# Patient Record
Sex: Male | Born: 1950 | Race: Black or African American | Hispanic: No | Marital: Married | Smoking: Former smoker
Health system: Southern US, Community
[De-identification: ages and names within clinical notes are randomized; demographics above are authoritative.]

## PROBLEM LIST (undated history)

## (undated) DIAGNOSIS — K219 Gastro-esophageal reflux disease without esophagitis: Secondary | ICD-10-CM

## (undated) DIAGNOSIS — E785 Hyperlipidemia, unspecified: Secondary | ICD-10-CM

## (undated) DIAGNOSIS — N189 Chronic kidney disease, unspecified: Secondary | ICD-10-CM

## (undated) DIAGNOSIS — E119 Type 2 diabetes mellitus without complications: Secondary | ICD-10-CM

## (undated) DIAGNOSIS — R202 Paresthesia of skin: Secondary | ICD-10-CM

## (undated) DIAGNOSIS — I10 Essential (primary) hypertension: Secondary | ICD-10-CM

## (undated) DIAGNOSIS — G4730 Sleep apnea, unspecified: Secondary | ICD-10-CM

## (undated) DIAGNOSIS — J309 Allergic rhinitis, unspecified: Secondary | ICD-10-CM

## (undated) HISTORY — DX: Gastro-esophageal reflux disease without esophagitis: K21.9

## (undated) HISTORY — DX: Paresthesia of skin: R20.2

## (undated) HISTORY — DX: Type 2 diabetes mellitus without complications: E11.9

## (undated) HISTORY — DX: Chronic kidney disease, unspecified: N18.9

## (undated) HISTORY — PX: CYSTECTOMY: SUR359

## (undated) HISTORY — DX: Allergic rhinitis, unspecified: J30.9

## (undated) HISTORY — DX: Hyperlipidemia, unspecified: E78.5

## (undated) HISTORY — DX: Essential (primary) hypertension: I10

## (undated) HISTORY — DX: Sleep apnea, unspecified: G47.30

---

## 2004-04-30 ENCOUNTER — Encounter (INDEPENDENT_AMBULATORY_CARE_PROVIDER_SITE_OTHER): Payer: Self-pay | Admitting: Specialist

## 2004-04-30 ENCOUNTER — Ambulatory Visit (HOSPITAL_COMMUNITY): Admission: RE | Admit: 2004-04-30 | Discharge: 2004-04-30 | Payer: Self-pay | Admitting: *Deleted

## 2006-10-07 ENCOUNTER — Observation Stay (HOSPITAL_COMMUNITY): Admission: EM | Admit: 2006-10-07 | Discharge: 2006-10-08 | Payer: Self-pay | Admitting: Emergency Medicine

## 2008-05-17 ENCOUNTER — Encounter: Admission: RE | Admit: 2008-05-17 | Discharge: 2008-05-17 | Payer: Self-pay | Admitting: Internal Medicine

## 2009-05-30 ENCOUNTER — Observation Stay (HOSPITAL_COMMUNITY): Admission: EM | Admit: 2009-05-30 | Discharge: 2009-05-31 | Payer: Self-pay | Admitting: Emergency Medicine

## 2009-06-06 ENCOUNTER — Encounter: Payer: Self-pay | Admitting: Cardiovascular Disease

## 2009-12-05 ENCOUNTER — Encounter: Admission: RE | Admit: 2009-12-05 | Discharge: 2009-12-05 | Payer: Self-pay | Admitting: Internal Medicine

## 2010-07-20 ENCOUNTER — Ambulatory Visit: Payer: Self-pay | Admitting: Cardiovascular Disease

## 2010-07-27 ENCOUNTER — Ambulatory Visit: Payer: Self-pay | Admitting: Cardiovascular Disease

## 2010-07-27 ENCOUNTER — Ambulatory Visit: Payer: Self-pay | Admitting: Cardiology

## 2010-07-27 ENCOUNTER — Encounter: Payer: Self-pay | Admitting: Cardiovascular Disease

## 2010-07-27 ENCOUNTER — Ambulatory Visit (HOSPITAL_COMMUNITY): Admission: RE | Admit: 2010-07-27 | Discharge: 2010-07-27 | Payer: Self-pay | Admitting: Cardiology

## 2010-11-25 ENCOUNTER — Encounter: Payer: Self-pay | Admitting: Internal Medicine

## 2011-02-10 LAB — DIFFERENTIAL
Eosinophils Relative: 1 % (ref 0–5)
Lymphocytes Relative: 28 % (ref 12–46)
Neutro Abs: 2.7 10*3/uL (ref 1.7–7.7)
Neutrophils Relative %: 53 % (ref 43–77)

## 2011-02-10 LAB — BASIC METABOLIC PANEL
BUN: 19 mg/dL (ref 6–23)
CO2: 30 mEq/L (ref 19–32)
GFR calc non Af Amer: 58 mL/min — ABNORMAL LOW (ref >60)
Potassium: 3.6 mEq/L (ref 3.5–5.1)

## 2011-02-10 LAB — CK TOTAL AND CKMB (NOT AT ARMC)
CK, MB: 13.4 ng/mL — ABNORMAL HIGH (ref 0.3–4.0)
Relative Index: 0.8 (ref 0.0–2.5)
Total CK: 1703 U/L — ABNORMAL HIGH (ref 7–232)

## 2011-02-10 LAB — POCT CARDIAC MARKERS: Myoglobin, poc: 177 ng/mL (ref 12–200)

## 2011-02-10 LAB — CBC
HCT: 44.3 % (ref 39.0–52.0)
RDW: 11.7 % (ref 11.5–15.5)
WBC: 5.1 10*3/uL (ref 4.0–10.5)

## 2011-02-10 LAB — LIPID PANEL
Cholesterol: 214 mg/dL — ABNORMAL HIGH (ref 0–200)
Triglycerides: 127 mg/dL (ref <150)

## 2011-02-10 LAB — TROPONIN I: Troponin I: 0.01 ng/mL (ref 0.00–0.06)

## 2011-02-10 LAB — CARDIAC PANEL(CRET KIN+CKTOT+MB+TROPI): Relative Index: 0.8 (ref 0.0–2.5)

## 2011-03-19 NOTE — H&P (Signed)
[name], [name] NO.:  [id number]   MEDICAL RECORD NO.:  [id number]          PATIENT TYPE:  INP   LOCATION:  2036                         FACILITY:  [facility]   PHYSICIAN:  Vesta Mixer, M.D. DATE OF BIRTH:  02/28/1951   DATE OF ADMISSION:  05/30/2009  DATE OF DISCHARGE:                              HISTORY & PHYSICAL   HISTORY:  [name] Holt is a middle-aged gentleman with a history of  hypertension.  He is admitted to the hospital with episodes of chest  pain.   Mr. Markovic is a middle-aged gentleman with a history of hypertension.  I met him in December 2007.  We performed an outpatient stress test,  which was negative.   The patient was due to see me next month for his yearly visit.   The patient also has a history of hyperlipidemia.   The patient has overall done well but for the past 2-3 weeks has had  significant episodes of chest pain and chest discomfort.  These  typically occur at various times.  They are not necessarily associated  with exercise.  In fact, he exercises and does lots of hard work without  any real worsening of these episodes of chest pain.  At one point, he  thought that they were related to eating, but at other times, they have  come on at various times and are not related to eating.   The patient stated the pain has lasted for about an hour and end up  until as many as 3 or 4 hours.  There is no radiation.  There is no  diaphoresis.  They are described as a sharp pain and a pressure-like  sensation.  It is associated with some shortness of breath.  There is no  pleuritic component to the pain, however.   The patient presented to the ER today after having episodes of chest  discomfort.  He is currently pain free.   His current medications are Maxzide once a day.   PAST MEDICAL HISTORY:  1. Hyperlipidemia.  2. Hypertension.   SOCIAL HISTORY:  The patient is a nonsmoker.  He drinks alcohol  occasionally.  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   Family history is positive for cardiac disease.  His brother has had  several myocardial infarctions.  His mother and father both had strokes.   His review of systems was reviewed in its entirety.  Please see the H  and P.  He denies any blood in his urine or blood in his stool.  He  denies any easy bruising.  He denies any gastroesophageal reflux  symptoms.  He denies any heat or cold intolerance or weight gain or  weight loss.  He does have some palpitations.  He denies any anxiety or  nervousness.  He denies any skin changes.  He denies any problems with  his eyes, ears, nose, and throat.  He denies any urinary dribbling.  All  other systems were negative.   On exam, he is a middle-aged gentleman in no  acute distress.  He is  alert and oriented x3, and his mood and affect are normal.  His blood  pressure is 140/70 with heart rate of 65.  His HEENT exam reveals 2+  carotids.  He has no bruits, no JVD, no thyromegaly.  Sclerae  nonicteric.  His mucous membranes are moist.  The lungs are clear.  Heart is regular rate, S1 and S2.  He has a soft systolic murmur.  His  abdominal exam reveals good bowel sounds and no hepatosplenomegaly.  He  has no areas of tenderness.  His extremities, he has no clubbing,  cyanosis, or edema.  There are no palpable cords.  His pulses are  intact.  His skin reveals no rashes or skin nodules.  His neuro exam  reveals cranial nerves II through XII are intact, and his motor and  sensory functions are intact.   His EKG reveals normal sinus rhythm.  He has left ventricular  hypertrophy.  There is no ST or T-wave changes.   His initial set of cardiac enzymes are negative.  His white blood cell  count is 5.1, his hemoglobin is 14.8, and hematocrit is 44.3.  His  sodium is 138, potassium is 3.6, chloride is 102, CO2 is 30, glucose is  95, creatinine is 1.12.  His myoglobin  is 268.  His troponin is less  than 0.05.   His chest x-ray was reviewed, it is essentially normal.   [name] Holt presents with episodes of chest discomfort.  He does have a  history of hypertension and hypercholesterolemia.  We will admit him to  rule out myocardial infarction.  We will check an EKG in the morning.  We will also check a fasting lipid profile in the morning.  If his  enzymes are negative, I think that we can discharge him and have him  perform an outpatient stress test.  If he has any abnormality in his  cardiac enzymes, he should probably stay for heart catheterization.  We  have discussed this with the patient, he understands and agrees.  All of  his other medical problems are stable.      Vesta Mixer, M.D.  Electronically Signed     PJN/MEDQ  D:  05/30/2009  T:  05/31/2009  Job:  [id number]   cc:   Soyla Murphy. Renne Crigler, M.D.

## 2011-03-19 NOTE — Discharge Summary (Signed)
[name] Holt, NAZIR NO.:  [id number]   MEDICAL RECORD NO.:  [id number]          PATIENT TYPE:  INP   LOCATION:  2036                         FACILITY:  [facility]   PHYSICIAN:  Vesta Mixer, M.D. DATE OF BIRTH:  09-08-1951   DATE OF ADMISSION:  05/30/2009  DATE OF DISCHARGE:  05/31/2009                               DISCHARGE SUMMARY   DISCHARGE DIAGNOSES:  1. Noncardiac chest pain.  2. Hypertension.  3. Elevated CPK levels.  4. Mild hypercholesterolemia.   DISCHARGE MEDICATION:  Maxzide 37.5 mg/25 mg once a day.   DISPOSITION:  The patient will see Dr. Elease Hashimoto in 1-2 weeks for a stress  Cardiolite study.  He is to call us sooner if he has any episodes of  chest pain.   HISTORY:  [name] is a 59 year old gentleman with a history of  hypertension.  He was admitted on May 30, 2009 with episodes of chest  discomfort.  Please see dictated H&P for further details.   HOSPITAL COURSE:  1. Chest pain.  The patient ruled out for myocardial infarction.  He      had elevated total CPK levels.  His initial CPK was 1703.  His MB      was 13.4 giving a relative index of 0.8.  His troponin was 0.01.      Repeat total CPK was 1136 with 8.8 MBs with a relative index of      0.8.  His troponin remained 0.01.   His total cholesterol was 214.  His triglycerides were 127, HDL was 48,  and LDL was 141.   The patient's EKG remained unchanged.  I suspect that his episodes of  chest pain are musculoskeletal.  There was no evidence of acute coronary  syndrome.   The patient will return to see Dr. Elease Hashimoto in a week or so for a stress  Cardiolite study.  We performed a stress Cardiolite study 3 years ago  which was negative for ischemia.  The patient to stay hydrated when he  is doing his physical labor.  1. Hypercholesterolemia.  The patient's LDL is 141.  I would probably      like to start him on a low-dose statin.  I would like to let his      CPK level go down before  we start the statin medication.  2. Hypokalemia.  The patient's potassium on admission was 3.6.  He is      on Maxzide.  I have asked him to increase      his potassium intake slightly.  This may be causing some of his      muscle aches and elevated CPK levels.  3. Hypertension.  His blood pressure throughout the admission was well-      controlled.  On discharge his blood pressure was 112/65.      Vesta Mixer, M.D.  Electronically Signed     PJN/MEDQ  D:  05/31/2009  T:  05/31/2009  Job:  [id number]

## 2011-03-22 NOTE — Consult Note (Signed)
NAME:  [name], Holt NO.:  [id number]   MEDICAL RECORD NO.:  [id number]          PATIENT TYPE:  INP   LOCATION:  4729                         FACILITY:  [facility]   PHYSICIAN:  Vesta Mixer, M.D. DATE OF BIRTH:  [birth year]   DATE OF CONSULTATION:  10/08/2006  DATE OF DISCHARGE:                                 CONSULTATION   [name] Holt is a 60 year old gentleman who was admitted to the  hospital with some episodes of chest pain.  We were asked to see him  today for episodes of chest pains and some PVCs.   [name] Holt is a relatively healthy gentleman.  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   Yesterday, he was moving some furniture.  He developed some chest pain  while moving the furniture.  The pain is described as a tightness.  He  had been working hard and was sweating, but he does not think that the  sweating was necessarily associated with the chest pain.  He did not  have any nausea, vomiting, syncope or presyncope.  He presented to his  medical doctors and was referred on to the emergency room.   One of his friends gave him some aspirin and the pain resolved about 30  minutes later.  He has not had any recurrent episodes of chest pain  since that time.  He has not had any recurrent chest pain since his  admission.  He has been breathing quite well and has been up ambulating  in the halls without any problems.   CURRENT MEDICATIONS:  Maxzide once a day.   ALLERGIES:  NONE.   PAST MEDICAL HISTORY:  1. Hypertension.  2. Hypercholesterolemia.   SOCIAL HISTORY:  The patient used to smoke but quit 15 years ago.   FAMILY HISTORY:  Positive coronary artery disease.   REVIEW OF SYSTEMS:  Reviewed and is otherwise negative except as noted  in HPI.   PHYSICAL EXAMINATION:  GENERAL:  He is a middle-aged gentleman in no  acute distress.  He is alert and oriented x3 and his mood and  affect are  normal.  VITAL SIGNS:  His temperature is 97.9, blood pressure is 117/78 with a  heart rate of 70s.  HEENT:  Reveals 2+ carotids.  He has no bruits, no JVD, no thyromegaly.  LUNGS:  Clear to auscultation.  HEART:  Regular rate, S1-S2 with no murmurs.  ABDOMEN:  Reveals good bowel sounds and it is nontender.  EXTREMITIES:  He has no clubbing, cyanosis or erythema.  NEUROLOGIC:  Nonfocal.   His EKG today reveals normal sinus rhythm.  He has early repolarization  changes.   His EKG from yesterday reveals a normal sinus rhythm with premature  ventricular contractions.   His laboratory data reveals a very high total CPK but with an  unimpressive CPK-MB.  His relative indexes are all low.  His troponin is  negative.  His potassium is 3.3.   IMPRESSION AND PLAN:  1. Chest pain.  this is most likely consistent with  a myositis.  He      had CPK levels in the 1200 range.  He had been moving furniture all      day and may have been a little bit dehydrated.  We will have him      drink more fluids when he is working in the future.  I think that      it is safe to discharge him from the hospital.  All of his troponin      levels have been negative.  We have set him up for an outpatient      stress test Friday morning in our office.  He is to call me sooner      if he has any problems.  2. Premature ventricular contractions.  The patient's potassium was      3.3.  We will encourage him to eat higher potassium foods.  He may      need to be on a different blood pressure medicine besides the      diuretic.  We will continue to follow this.  3. Hypercholesterolemia.  The patient's triglyceride level is 64,      total cholesterol 246, HDL 57, and his LDL is 176.  He will quite      likely need to go on a Statin.  I have encouraged him to eat a low      fat, low cholesterol diet.  We will discuss this as an outpatient.   Thank you very much for this consultation.            ______________________________  Vesta Mixer, M.D.     PJN/MEDQ  D:  10/08/2006  T:  10/08/2006  Job:  [id number]   cc:   Soyla Murphy. Renne Crigler, M.D.

## 2011-03-22 NOTE — Op Note (Signed)
NAME:  LINDELL, RENFREW NO.:  [id number]   MEDICAL RECORD NO.:  [id number]                   PATIENT TYPE:  AMB   LOCATION:  ENDO                                 FACILITY:  [facility]   PHYSICIAN:  Georgiana Spinner, M.D.                 DATE OF BIRTH:  1951-03-10   DATE OF PROCEDURE:  DATE OF DISCHARGE:                                 OPERATIVE REPORT   PROCEDURE:  Endoscopy.   INDICATIONS FOR PROCEDURE:  Gastroesophageal reflux disease.   ANESTHESIA:  Demerol 50, Versed 5 mg.   DESCRIPTION OF PROCEDURE:  With the patient mildly sedated in the left  lateral decubitus position, the Olympus videoscopic endoscope was inserted  in the mouth and passed under direct vision through the esophagus which  appeared normal. There was no evidence of Barrett's.  We entered into the  stomach, fundus, body, antrum, duodenal bulb and second portion of the  duodenum and all appeared normal. From this point, the endoscope was slowly  withdrawn taking circumferential views of the duodenal mucosa until the  endoscope was then pulled back in the stomach, placed in retroflexion to  view the stomach from below. The endoscope was then straightened and  withdrawn taking circumferential views of the remaining gastric and  esophageal mucosa. The patient's vital signs and pulse oximeter remained  stable. The patient tolerated the procedure well without apparent  complications.   FINDINGS:  Unremarkable examination.   PLAN:  Proceed to colonoscopy.                                               Georgiana Spinner, M.D.    GMO/MEDQ  D:  04/30/2004  T:  04/30/2004  Job:  [id number]

## 2011-03-22 NOTE — Op Note (Signed)
NAME:  [name] Holt, CAROTHERS NO.:  [id number]   MEDICAL RECORD NO.:  [id number]                   PATIENT TYPE:  AMB   LOCATION:  ENDO                                 FACILITY:  [facility]   PHYSICIAN:  Georgiana Spinner, M.D.                 DATE OF BIRTH:  10/29/1951   DATE OF PROCEDURE:  DATE OF DISCHARGE:                                 OPERATIVE REPORT   PROCEDURE:  Colonoscopy.   INDICATIONS FOR PROCEDURE:  Colon polyps.   ANESTHESIA:  Demerol 20, Versed 2 mg.   DESCRIPTION OF PROCEDURE:  With the patient mildly sedated in the left  lateral decubitus position, a rectal examination was performed which was  unremarkable other than the feeling of hemorrhoids.  Subsequently, the  Olympus videoscopic colonoscope was inserted in the rectum and passed under  direct vision to the cecum identified by base of cecum and ileocecal valve  both of which were photographed. From this point, the colonoscope was slowly  withdrawn taking circumferential views of the colonic mucosa stopping then  only in the rectum where a small polyp was seen between 10 and 20 cm from  the anal verge, it was photographed and removed using hot biopsy forceps  technique on a setting of 20:20 blended current.  The endoscope was then  placed in the retroflexion to view the anal canal from above. Hemorrhoids  were seen, the endoscope was straightened and withdrawn. The patient's vital  signs and pulse oximeter remained stable. The patient tolerated the  procedure well without apparent complications.   FINDINGS:  Internal hemorrhoids, small polyp in rectum were removed.  Await  biopsy report. The patient will call me for results and followup with me as  an outpatient.                                               Georgiana Spinner, M.D.    GMO/MEDQ  D:  04/30/2004  T:  04/30/2004  Job:  [id number]

## 2011-03-22 NOTE — H&P (Signed)
NAME:  [name] Holt, [name] Holt NO.:  [id number]   MEDICAL RECORD NO.:  [id number]          PATIENT TYPE:  INP   LOCATION:  1824                         FACILITY:  [facility]   PHYSICIAN:  Elliot Cousin, M.D.    DATE OF BIRTH:  Mar 11, 1951   DATE OF ADMISSION:  10/07/2006  DATE OF DISCHARGE:                              HISTORY & PHYSICAL   PRIMARY CARE PHYSICIAN:  Soyla Murphy. Renne Crigler, M.D.   CHIEF COMPLAINT:  Chest pain.   HISTORY OF PRESENT ILLNESS:  The patient is a 60 year old man, with a  past medical history significant for hypertension, who presents to the  emergency department with a chief complaint of chest pain.  The chest  pain started at approximately 4:30 p.m. while he was moving furniture.  The chest pain was accompanied by shortness of breath.  The pain and  shortness of breath lasted approximately 30-40 minutes.  He had no  complaints of nausea, lightheadedness or radiation of the pain.  The  patient states that he was already sweating and therefore he is unsure  whether the sweating worsened with the chest pain.  He rates the pain as  moderate in intensity. It worsens when he breathes deeply.  At the time  that the pain occurred, he sat down and the pain subsided but did not  completely resolve.  He decided to present to his primary care  physician's office today.  When he arrived to the emergency department,  he was virtually chest painfree.  The patient was given aspirin but no  sublingual nitroglycerin.   In Dr. Carolee Rota office, the patient's blood pressure was 142/100.  His  EKG revealed a heart rate of 87 beats per minute and multiple  ventricular premature complexes.  Of note, the patient did describe a  feeling of palpitations intermittently earlier today.  His review of  systems is also positive for right calf pain but no swelling in his  legs.   During the evaluation in the emergency department, the patient is noted  to be hemodynamically stable and  chest painfree.  His EKG reveals normal  sinus rhythm with a heart rate of 83 beats per minute and frequent PVCs  and PACs.  His CK is 1151 with a CK-MB of 10.8 and a relative index of  0.9 (within normal limits).  His initial troponin I is less than 0.05.  The patient will be admitted for further evaluation and management.   PAST MEDICAL HISTORY:  1. Chest pain approximately seven years ago.  The patient believes      that he underwent a stress test at that time and it was apparently      negative.  The patient presented to Dr. Carolee Rota office again in      March with chest pain which was felt to be noncardiac in origin.  2. Hypertension.  3. Mild chronic leukopenia.  4. History of heel pad syndrome.  5. Status post cyst excision from the wrist in the past.   MEDICATIONS:  Triamterene/HCTZ 37.5/25 mg daily.   ALLERGIES:  No known drug allergies.   SOCIAL HISTORY:  The  patient is married.  [place]  He has no children.  He is employed as a Teacher, English as a foreign language.  He stopped smoking cigarettes approximately 15 to 20 years  ago.  He drinks alcohol occasionally.  He denies illicit drug use.   FAMILY HISTORY:  The patient's mother died of a stroke in her late 31s.  She also had a history of hypertension and a heart attack.  His father  died of a stroke in his late 9s.   REVIEW OF SYSTEMS:  The patient's review of systems is positive for  right leg pain, particularly in the calf area which started  approximately two to three days ago.   EXAM:  Temperature 97.1, blood pressure 131/86, pulse 86, respiratory  rate 20, oxygen saturation 99% on two liters of nasal cannula oxygen.  GENERAL:  The patient was a pleasant 60 year old [place] man  who is currently lying in bed in no acute distress.  HEENT:  Head is normocephalic and atraumatic.  Pupils are equal, round,  and reactive to light.  Extraocular movements are intact.  Conjunctivae   are clear.  Sclerae are white.  Nasal mucosa is mildly dry.  No sinus  tenderness.  Oropharynx reveals good dentition.  Mucus membranes are  mildly dry.  No posterior exudates or erythema.  Neck is supple.  No  adenopathy and no thyromegaly.  No bruit and no JVD.  LUNGS:  Clear to auscultation bilaterally.  HEART:  S1-S2 with an ectopic beat.  CHEST WALL:  No tenderness over the anterior chest wall.  ABDOMEN:  Positive bowel sounds, soft, nontender, and nondistended.  No  hepatosplenomegaly.  No masses palpated.  EXTREMITIES:  Mild tenderness at the popliteal area of the right leg.  Lymph nodes not palpable.  No calf edema, erythema or tenderness  bilaterally.  No pretibial and no pedal edema.  Pedal pulses are 2+  bilaterally.  NEUROLOGICAL:  The patient is alert and oriented x3.  Cranial nerves II  through XII are intact.  Strength is 5/5 throughout.  Sensation is  intact.   EKG:  Results are above.  Chest x-ray is pending.  Sodium 137, potassium  3.6, chloride 107, BUN 50, glucose 92, bicarbonate 26, creatinine 1.3.  Hematocrit 47, hemoglobin 16.  Troponin I less than 0.05.  CK 1151, CK-  MB 10.9.   ASSESSMENT:  1. Chest pain:  The patient may have angina.  He certainly has risk      factors for cardiac disease including family history, age and      hypertension.  The patient also gives a history of pleurisy and      therefore pulmonary embolus and pericarditis are concerns. A      musculoskeletal etiology is also a consideration.  2. Right lower extremity leg pain:  Deep vein thrombosis will need to      be ruled out.  3. EKG findings consistent with premature ventricular contractures and      premature atrial contractures.  Magnesium deficiency will need to      be ruled out.  The patient was questioned about illicit drugs and      he denies illicit drug use.  In addition, he drinks only two cups      of coffee daily. 4. Hypertension:  The patient's blood pressure appears to  be well      controlled.   PLAN:  1. The patient will be admitted for further  evaluation and management.  2. We will assess the patient further with cardiac enzymes q.8h. x3, a      chest x-ray and a d-dimer.  If the patient's d-dimer is elevated,      we will check a CT scan of the chest to rule out PE.  3. We will check a right lower extremity venous Doppler study to rule      out DVT.  4. We will check a TSH, fasting lipid panel, magnesium level and      repeat an EKG in the morning.  5. We will start aspirin therapy, intravenous heparin, beta blocker      therapy with metoprolol and p.r.n. morphine for pain.  We will also      add p.r.n. sublingual nitroglycerin.  6. We will check a 2-D echocardiogram to rule out valvular      abnormalities.      Elliot Cousin, M.D.  Electronically Signed     DF/MEDQ  D:  10/07/2006  T:  10/07/2006  Job:  [id number]   cc:   Soyla Murphy. Renne Crigler, M.D.

## 2011-10-21 ENCOUNTER — Other Ambulatory Visit: Payer: Self-pay | Admitting: *Deleted

## 2011-10-21 MED ORDER — ROSUVASTATIN CALCIUM 20 MG PO TABS: 20.0000 mg | ORAL_TABLET | Freq: Every day | ORAL | Status: DC

## 2011-11-14 ENCOUNTER — Telehealth: Payer: Self-pay | Admitting: Cardiovascular Disease

## 2011-11-14 NOTE — Telephone Encounter (Signed)
LOV,Echo,Stress,12 ( [employer] Card) faxed to [facility] @ [phone] 11/14/11/Km

## 2012-03-16 ENCOUNTER — Encounter: Payer: Self-pay | Admitting: *Deleted

## 2012-03-16 DIAGNOSIS — E785 Hyperlipidemia, unspecified: Secondary | ICD-10-CM | POA: Insufficient documentation

## 2012-03-16 DIAGNOSIS — I10 Essential (primary) hypertension: Secondary | ICD-10-CM | POA: Insufficient documentation

## 2012-03-16 DIAGNOSIS — R0789 Other chest pain: Secondary | ICD-10-CM | POA: Insufficient documentation

## 2013-12-15 ENCOUNTER — Other Ambulatory Visit: Payer: Self-pay | Admitting: Otolaryngology

## 2013-12-15 DIAGNOSIS — R221 Localized swelling, mass and lump, neck: Principal | ICD-10-CM

## 2013-12-15 DIAGNOSIS — R220 Localized swelling, mass and lump, head: Secondary | ICD-10-CM

## 2013-12-23 ENCOUNTER — Ambulatory Visit
Admission: RE | Admit: 2013-12-23 | Discharge: 2013-12-23 | Disposition: A | Discharge status: Home / Self Care | Payer: BC Managed Care – PPO | Source: Ambulatory Visit | Attending: Otolaryngology | Admitting: Otolaryngology

## 2013-12-23 DIAGNOSIS — R220 Localized swelling, mass and lump, head: Secondary | ICD-10-CM

## 2013-12-23 DIAGNOSIS — R221 Localized swelling, mass and lump, neck: Principal | ICD-10-CM

## 2013-12-23 MED ORDER — IOHEXOL 300 MG/ML  SOLN
75.0000 mL | Freq: Once | INTRAMUSCULAR | Status: AC | PRN
  Administered 2013-12-23: 75 mL via INTRAVENOUS

## 2015-11-19 IMAGING — CT CT NECK W/ CM
4 of 6 series · 15 of 33 positions shown, 17 images · IV contrast (75CC OMNI 300)
Comparison: Thyroid ultrasound 12/05/2009

CLINICAL DATA: Swelling, mass, or lump in head neck.

BUN and creatinine were obtained on site at [HOSPITAL] at
[HOSPITAL].Results: BUN 9 mg/dL, Creatinine 1.1 mg/dL.
EXAM:
CT NECK WITH CONTRAST
TECHNIQUE: Multidetector CT imaging of the neck was performed using the
standard protocol following the bolus administration of intravenous
contrast.
CONTRAST:  75mL OMNIPAQUE IOHEXOL 300 MG/ML  SOLN

[Series 2: axial neck · axial · 0.44mm/px · z∈[+45,+215]mm · 4 of 114 slices shown, 5 images]
[im 23/114  soft-tissue]
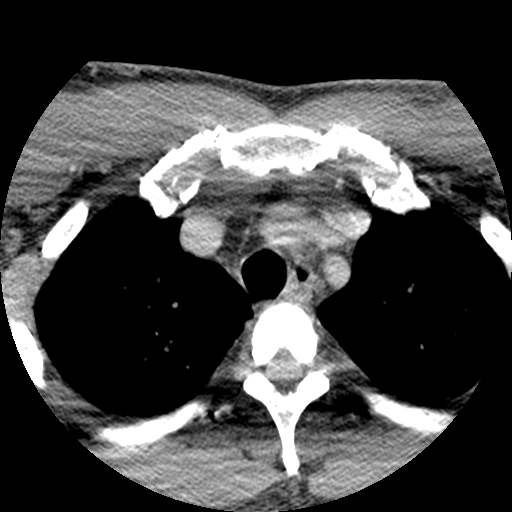
[im 23/114  bone]
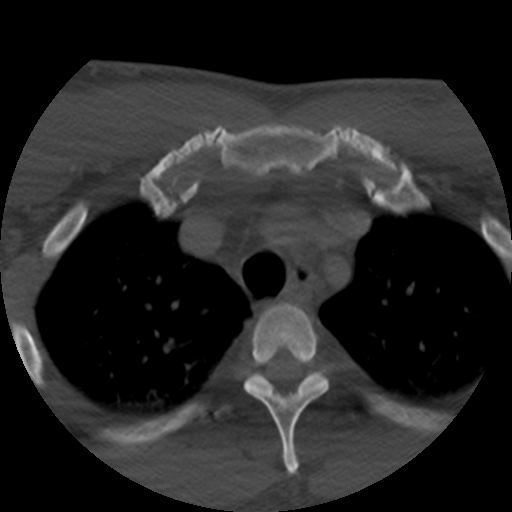
[im 46/114  bone]
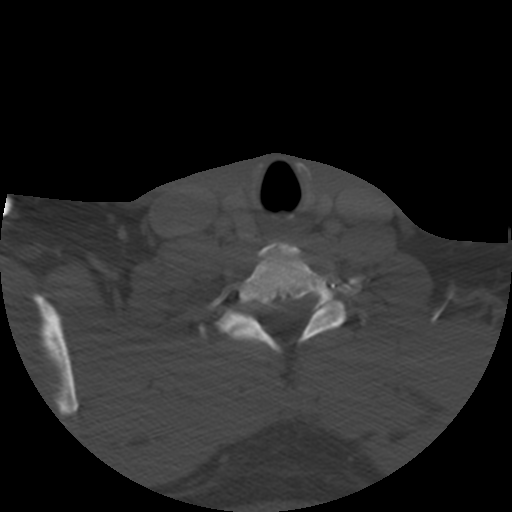
[im 68/114  bone]
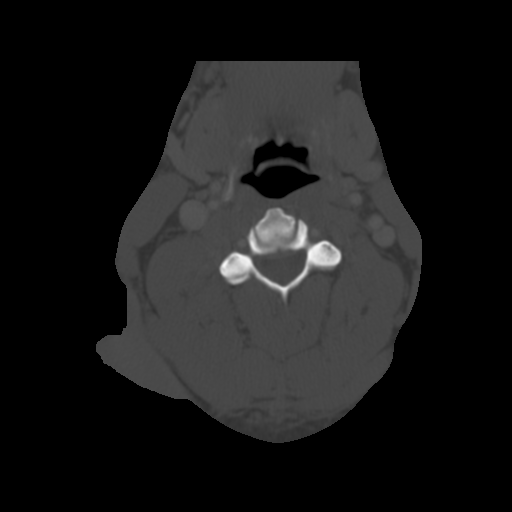
[im 91/114  bone]
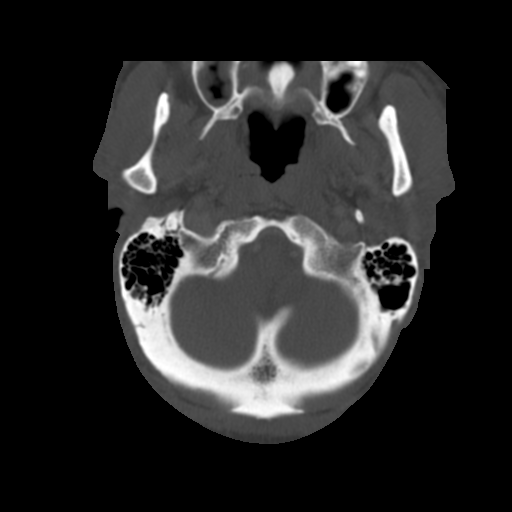

[Series 400: cor · coronal · 0.57mm/px · 3 of 95 slices shown]
[im 19/95  bone]
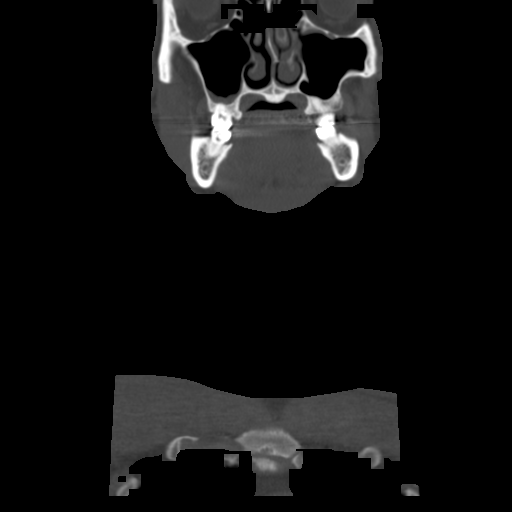
[im 38/95  bone]
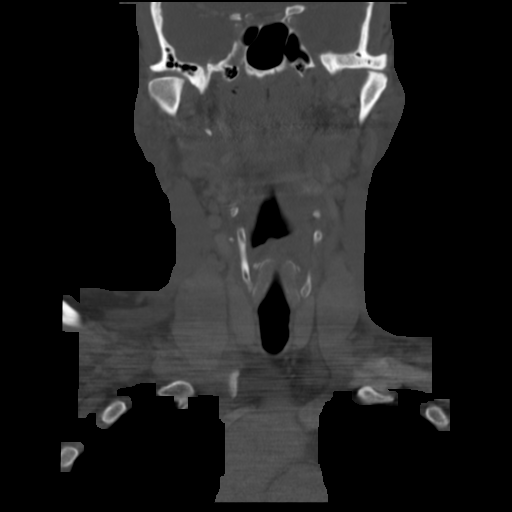
[im 57/95  bone]
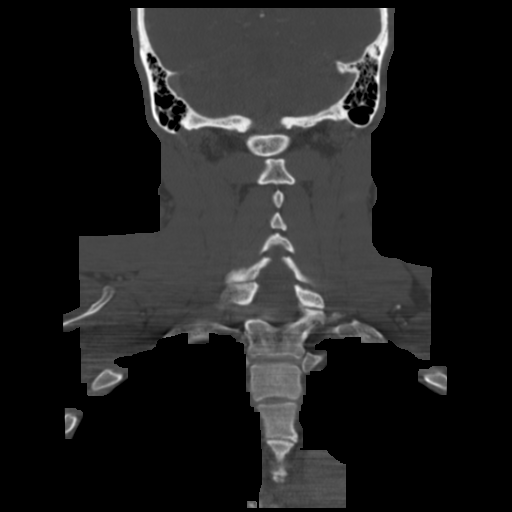

[Series 401: sag · sagittal · 0.57mm/px · 5 of 91 slices shown, 6 images]
[im 31/91  bone]
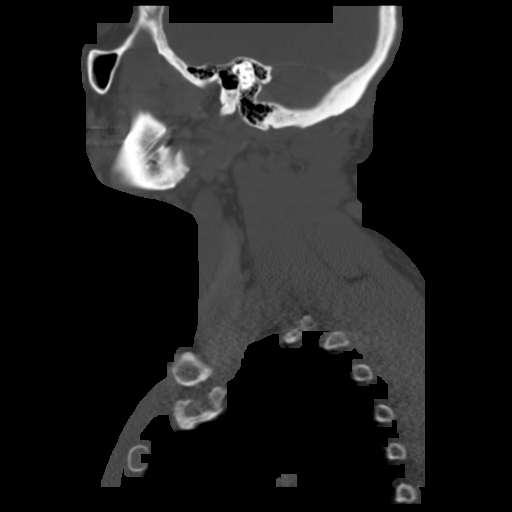
[im 38/91  bone]
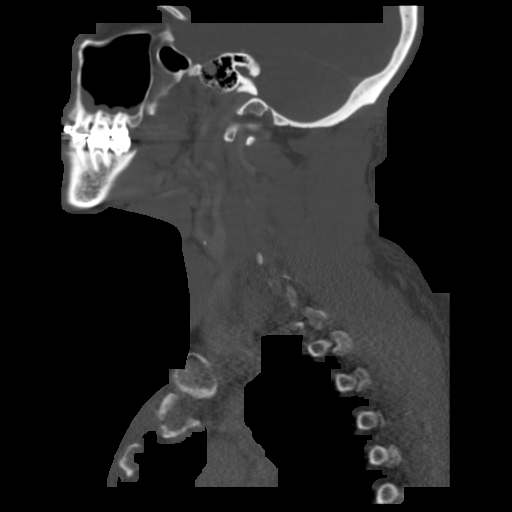
[im 46/91  soft-tissue]
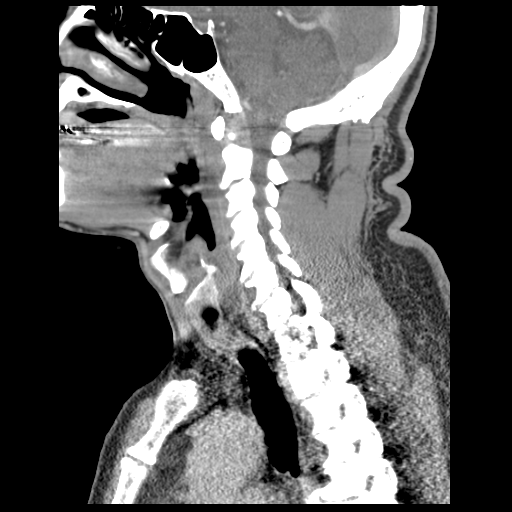
[im 46/91  bone]
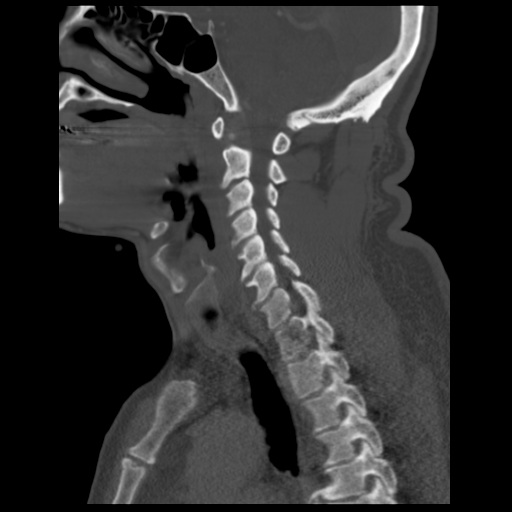
[im 53/91  bone]
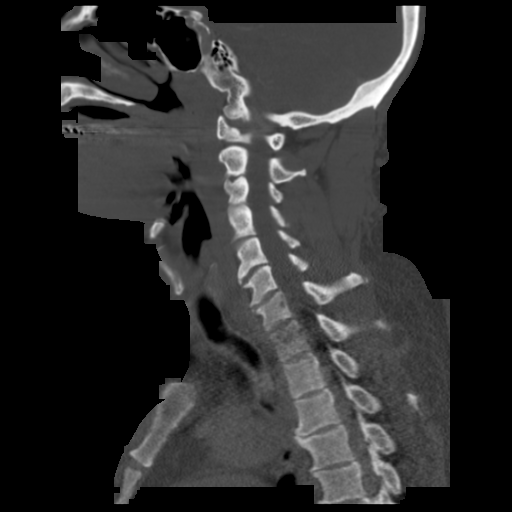
[im 61/91  bone]
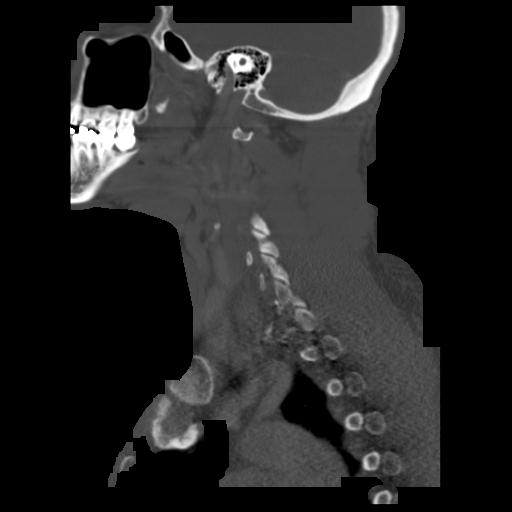

[Series 402: hyoid angle · axial · 0.57mm/px · z∈[+38,+155]mm · 3 of 96 slices shown]
[im 24/96  bone]
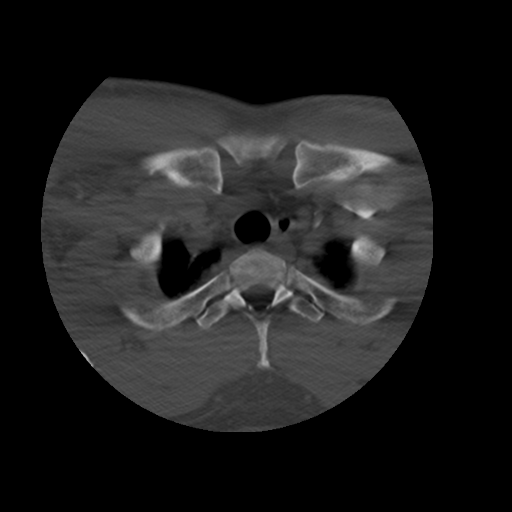
[im 48/96  bone]
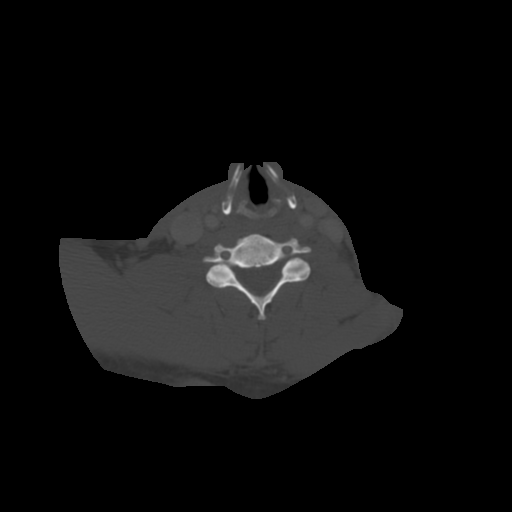
[im 72/96  bone]
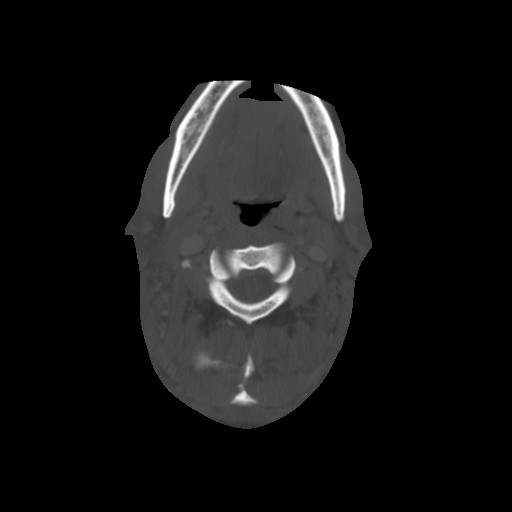

[15 of 33 positions shown; findings below may reference images not displayed]

FINDINGS: Vitamin-E capsule marks the palpable abnormality in the submental
area. This corresponds to a lymph node measuring 11 mm, anterior to
the hyoid bone. This does not appear to be pathologically enlarged.
No associated mass or cyst is present in the area. Negative for
thyroglossal duct cyst.

Mild mucosal edema in the right maxillary sinus. The skullbase is
intact.

The tongue is normal. Submandibular and parotid glands appear
normal. Larynx is normal and symmetric. Thyroid is normal in size
without mass lesion. Lung apices are clear. Mild cervical
degenerative change.

No pathologic adenopathy.
IMPRESSION: Palpable abnormality corresponds to an 11 mm lymph node in the
submental area, this is not a pathologic node. No mass or adenopathy
is present elsewhere in the neck.

## 2019-12-26 NOTE — Progress Notes (Signed)
Cardiology Office Note:   Date:  12/27/2019  NAME:  [name] Holt    MRN: [id number] DOB:  1950/12/19   PCP:  Deland Pretty, MD  Cardiologist:  No primary care provider on file.   Referring MD: Deland Pretty, MD   Chief Complaint  Patient presents with  . Shortness of Breath    comes and goes    History of Present Illness:   [name] Holt is a 69 y.o. male with a hx of hypertension, hyperlipidemia who is being seen today for the evaluation of dyspnea on exertion at the request of Deland Pretty, MD.  He reports for the last 1 month he has had pressure in the center of his chest.  He reports it is a constant pressure.  It is worse with certain foods and resolved without intervention.  He reports at times it can be worse with exertion.  He was seen by his primary care physician and started on pantoprazole.  He has noted some improvement with this.  He apparently had a similar symptoms about 10 years ago and underwent a stress test that was normal.  He also was noted to have a cardiac murmur and underwent echocardiogram which was unremarkable.  Associated symptoms also include shortness of breath.  He reports when he walks or does any minimal activity he gets quite winded.  This is not usual for him.  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  He reports that he maintain a high level of activity prior to his retirement.  He reports retirement has resulted in many new issues for him.  Review of recent lipid profile shows total cholesterol 133, triglycerides 56, HDL 62, LDL 59.  He does take Crestor.  Blood pressure today 142/80.  He does take bisoprolol for this.  His EKG today demonstrates LVH with no acute ischemic changes.  There is no evidence of prior infarction.  He is a former smoker.  He is not diabetic.  Family history is significant for heart attacks in mom and dad.  Past Medical History: Past Medical History:  Diagnosis Date  . Chest pain    NON  CARDIAC  . Hyperlipidemia   . Hypertension     Past Surgical History: Past Surgical History:  Procedure Laterality Date  . CYSTECTOMY     LEFT WRIST    Current Medications: Current Meds  Medication Sig  . aspirin 81 MG EC tablet Take 81 mg by mouth daily.  . bisoprolol (ZEBETA) 5 MG tablet PO 1x daily  . pantoprazole (PROTONIX) 40 MG tablet   . rosuvastatin (CRESTOR) 20 MG tablet Take 1 tablet (20 mg total) by mouth daily. (Patient taking differently: Take 10 mg by mouth daily. )  . XIIDRA 5 % SOLN Instill 1 drop into both eyes twice a day     Allergies:    Patient has no known allergies.   Social History: Social History   Socioeconomic History  . Marital status: Married    Spouse name: Not on file  . Number of children: Not on file  . Years of education: Not on file  . Highest education level: Not on file  Occupational History  . Not on file  Tobacco Use  . Smoking status: Former Smoker    Years: 10.00  . Smokeless tobacco: Never Used  Substance and Sexual Activity  . Alcohol use: Not Currently  . Drug use: No  . Sexual activity: Not on file  Other  Topics Concern  . Not on file  Social History Narrative  . Not on file   Social Determinants of Health   Financial Resource Strain:   . Difficulty of Paying Living Expenses: Not on file  Food Insecurity:   . Worried About Programme researcher, broadcasting/film/video in the Last Year: Not on file  . Ran Out of Food in the Last Year: Not on file  Transportation Needs:   . Lack of Transportation (Medical): Not on file  . Lack of Transportation (Non-Medical): Not on file  Physical Activity:   . Days of Exercise per Week: Not on file  . Minutes of Exercise per Session: Not on file  Stress:   . Feeling of Stress : Not on file  Social Connections:   . Frequency of Communication with Friends and Family: Not on file  . Frequency of Social Gatherings with Friends and Family: Not on file  . Attends Religious Services: Not on file  . Active  Member of Clubs or Organizations: Not on file  . Attends Banker Meetings: Not on file  . Marital Status: Not on file     Family History: The patient's family history includes Diabetes in his mother; Heart attack in his brother and father; Heart disease in his mother; Hypertension in his mother; Pneumonia in his brother; Stroke in his father and mother.  ROS:   All other ROS reviewed and negative. Pertinent positives noted in the HPI.     EKGs/Labs/Other Studies Reviewed:   The following studies were personally reviewed by me today:  EKG:  EKG is ordered today.  The ekg ordered today demonstrates normal sinus rhythm, heart 83, LVH noted, and was personally reviewed by me.   Recent Labs: No results found for requested labs within last 8760 hours.   Recent Lipid Panel    Component Value Date/Time   CHOL (H) 05/31/2009 0335    214        ATP III CLASSIFICATION:  <200     mg/dL   Desirable  474-259  mg/dL   Borderline High  >=563    mg/dL   High          TRIG 875 05/31/2009 0335   HDL 48 05/31/2009 0335   CHOLHDL 4.5 05/31/2009 0335   VLDL 25 05/31/2009 0335   LDLCALC (H) 05/31/2009 0335    141        Total Cholesterol/HDL:CHD Risk Coronary Heart Disease Risk Table                     Men   Women  1/2 Average Risk   3.4   3.3  Average Risk       5.0   4.4  2 X Average Risk   9.6   7.1  3 X Average Risk  23.4   11.0        Use the calculated Patient Ratio above and the CHD Risk Table to determine the patient's CHD Risk.        ATP III CLASSIFICATION (LDL):  <100     mg/dL   Optimal  643-329  mg/dL   Near or Above                    Optimal  130-159  mg/dL   Borderline  518-841  mg/dL   High  >660     mg/dL   Very High    Physical Exam:   VS:  BP Marland Kitchen)  142/80   Temp (!) 96.7 F (35.9 C)   Ht 5\' 10"  (1.778 m)   Wt 249 lb (112.9 kg)   SpO2 98%   BMI 35.73 kg/m    Wt Readings from Last 3 Encounters:  12/27/19 249 lb (112.9 kg)    General: Well  nourished, well developed, in no acute distress Heart: Atraumatic, normal size  Eyes: PEERLA, EOMI  Neck: Supple, no JVD Endocrine: No thryomegaly Cardiac: Normal S1, S2; 3/6 holosystolic murmur, does not radiate to carotids Lungs: Clear to auscultation bilaterally, no wheezing, rhonchi or rales  Abd: Soft, nontender, no hepatomegaly  Ext: No edema, pulses 2+ Musculoskeletal: No deformities, BUE and BLE strength normal and equal Skin: Warm and dry, no rashes   Neuro: Alert and oriented to person, place, time, and situation, CNII-XII grossly intact, no focal deficits  Psych: Normal mood and affect   ASSESSMENT:   [name] Holt is a 69 y.o. male who presents for the following: 1. SOB (shortness of breath) on exertion   2. Chest pain of uncertain etiology   3. Essential hypertension   4. Mixed hyperlipidemia   5. Murmur, cardiac     PLAN:   1. SOB (shortness of breath) on exertion 2. Chest pain of uncertain etiology -He presents with atypical pain.  Describes a central chest pressure that is worse with foods.  It does have typical component of being exertional at times but mainly occurs constantly.  He has noted some improvement of Protonix.  He also is short of breath with exertion.  CVD risk factors include obesity, hypertension, prior tobacco abuse.  We will proceed with Lexiscan nuclear medicine stress test to exclude any significant CAD.  He reports he would not like to pursue a cardiac CTA as this would require him going to the hospital.  He would prefer to do this in office. -We will also obtain a BNP.  He has no evidence of congestive heart failure on examination.  His shortness of breath could be deconditioning in the setting of recent retirement. -We will also obtain an echocardiogram as he has a significant 3 out of 6 holosystolic murmur.  I suspect this is mitral valve disease.  It is not very with respiration.  3. Essential hypertension -Continue bisoprolol for now  4.  Mixed hyperlipidemia -Most recent LDL 59.  Continue Crestor.  5. Murmur, cardiac -Echocardiogram as above.  Disposition: Return in about 1 month (around 01/24/2020).  Medication Adjustments/Labs and Tests Ordered: Current medicines are reviewed at length with the patient today.  Concerns regarding medicines are outlined above.  Orders Placed This Encounter  Procedures  . Brain natriuretic peptide  . MYOCARDIAL PERFUSION IMAGING  . EKG 12-Lead  . ECHOCARDIOGRAM COMPLETE   No orders of the defined types were placed in this encounter.   Patient Instructions  Medication Instructions:  The current medical regimen is effective;  continue present plan and medications.  *If you need a refill on your cardiac medications before your next appointment, please call your pharmacy*  Lab Work: BNP today If you have labs (blood work) drawn today and your tests are completely normal, you will receive your results only by: 01/26/2020 MyChart Message (if you have MyChart) OR . A paper copy in the mail If you have any lab test that is abnormal or we need to change your treatment, we will call you to review the results.  Testing/Procedures: Your physician has requested that you have a lexiscan myoview. A cardiac stress test is  a cardiological test that measures the heart's ability to respond to external stress in a controlled clinical environment. The stress response is induced by intravenous pharmacological stimulation.   Echocardiogram - Your physician has requested that you have an echocardiogram. Echocardiography is a painless test that uses sound waves to create images of your heart. It provides your doctor with information about the size and shape of your heart and how well your heart's chambers and valves are working. This procedure takes approximately one hour. There are no restrictions for this procedure. This will be performed at our [facility] location - [address].   Follow-Up: At  [facility], you and your health needs are our priority.  As part of our continuing mission to provide you with exceptional heart care, we have created designated Provider Care Teams.  These Care Teams include your primary Cardiologist (physician) and Advanced Practice Providers (APPs -  Physician Assistants and Nurse Practitioners) who all work together to provide you with the care you need, when you need it.  Your next appointment:   1 month(s)  The format for your next appointment:   In Person  Provider:   Lennie Odor, MD        Signed, Lenna Gilford. Flora Lipps, MD [facility]  [address] [place], [place] [address] [phone]  12/27/2019 2:13 PM

## 2019-12-27 ENCOUNTER — Other Ambulatory Visit: Payer: Self-pay

## 2019-12-27 ENCOUNTER — Ambulatory Visit: Payer: Medicare PPO | Admitting: Cardiovascular Disease

## 2019-12-27 ENCOUNTER — Encounter: Payer: Self-pay | Admitting: Cardiovascular Disease

## 2019-12-27 VITALS — BP 142/80 | Temp 96.7°F | Ht 70.0 in | Wt 249.0 lb

## 2019-12-27 DIAGNOSIS — R079 Chest pain, unspecified: Secondary | ICD-10-CM

## 2019-12-27 DIAGNOSIS — R011 Cardiac murmur, unspecified: Secondary | ICD-10-CM

## 2019-12-27 DIAGNOSIS — E782 Mixed hyperlipidemia: Secondary | ICD-10-CM | POA: Diagnosis not present

## 2019-12-27 DIAGNOSIS — R0602 Shortness of breath: Secondary | ICD-10-CM | POA: Diagnosis not present

## 2019-12-27 DIAGNOSIS — I10 Essential (primary) hypertension: Secondary | ICD-10-CM

## 2019-12-27 NOTE — Patient Instructions (Signed)
Medication Instructions:  The current medical regimen is effective;  continue present plan and medications.  *If you need a refill on your cardiac medications before your next appointment, please call your pharmacy*  Lab Work: BNP today If you have labs (blood work) drawn today and your tests are completely normal, you will receive your results only by: Marland Kitchen MyChart Message (if you have MyChart) OR . A paper copy in the mail If you have any lab test that is abnormal or we need to change your treatment, we will call you to review the results.  Testing/Procedures: Your physician has requested that you have a lexiscan myoview. A cardiac stress test is a cardiological test that measures the heart's ability to respond to external stress in a controlled clinical environment. The stress response is induced by intravenous pharmacological stimulation.   Echocardiogram - Your physician has requested that you have an echocardiogram. Echocardiography is a painless test that uses sound waves to create images of your heart. It provides your doctor with information about the size and shape of your heart and how well your heart's chambers and valves are working. This procedure takes approximately one hour. There are no restrictions for this procedure. This will be performed at our [facility] location - [address], [address].   Follow-Up: At [facility], you and your health needs are our priority.  As part of our continuing mission to provide you with exceptional heart care, we have created designated Provider Care Teams.  These Care Teams include your primary Cardiologist (physician) and Advanced Practice Providers (APPs -  Physician Assistants and Nurse Practitioners) who all work together to provide you with the care you need, when you need it.  Your next appointment:   1 month(s)  The format for your next appointment:   In Person  Provider:   Lennie Odor, MD

## 2019-12-28 ENCOUNTER — Ambulatory Visit (HOSPITAL_COMMUNITY): Payer: Medicare PPO | Attending: Cardiology

## 2019-12-28 DIAGNOSIS — R011 Cardiac murmur, unspecified: Secondary | ICD-10-CM

## 2019-12-28 LAB — BRAIN NATRIURETIC PEPTIDE: BNP: 2.8 pg/mL (ref 0.0–100.0)

## 2020-01-04 ENCOUNTER — Telehealth (HOSPITAL_COMMUNITY): Payer: Self-pay

## 2020-01-04 NOTE — Telephone Encounter (Signed)
Encounter complete. 

## 2020-01-06 ENCOUNTER — Other Ambulatory Visit: Payer: Self-pay

## 2020-01-06 ENCOUNTER — Ambulatory Visit (HOSPITAL_COMMUNITY)
Admission: RE | Admit: 2020-01-06 | Discharge: 2020-01-06 | Disposition: A | Discharge status: Home / Self Care | Payer: Medicare PPO | Source: Ambulatory Visit | Attending: Cardiology | Admitting: Cardiology

## 2020-01-06 DIAGNOSIS — R079 Chest pain, unspecified: Secondary | ICD-10-CM | POA: Diagnosis present

## 2020-01-06 LAB — MYOCARDIAL PERFUSION IMAGING
LV dias vol: 145 mL (ref 62–150)
LV sys vol: 67 mL
Peak HR: 80 {beats}/min
Rest HR: 58 {beats}/min
SDS: 3
SRS: 2
SSS: 5
TID: 1.14

## 2020-01-06 MED ORDER — REGADENOSON 0.4 MG/5ML IV SOLN
0.4000 mg | Freq: Once | INTRAVENOUS | Status: AC
  Administered 2020-01-06: 0.4 mg via INTRAVENOUS

## 2020-01-06 MED ORDER — TECHNETIUM TC 99M TETROFOSMIN IV KIT
10.6000 | PACK | Freq: Once | INTRAVENOUS | Status: AC | PRN
  Administered 2020-01-06: 10.6 via INTRAVENOUS
  Filled 2020-01-06: qty 11

## 2020-01-06 MED ORDER — TECHNETIUM TC 99M TETROFOSMIN IV KIT
30.8000 | PACK | Freq: Once | INTRAVENOUS | Status: AC | PRN
  Administered 2020-01-06: 30.8 via INTRAVENOUS
  Filled 2020-01-06: qty 31

## 2020-01-23 NOTE — Progress Notes (Signed)
Cardiology Office Note:   Date:  01/24/2020  NAME:  [name] Holt    MRN: [id number] DOB:  06-20-1951   PCP:  Merri Brunette, MD  Cardiologist:  Reatha Harps, MD   Referring MD: Merri Brunette, MD   Chief Complaint  Patient presents with  . Shortness of Breath   History of Present Illness:   [name] Holt is a 69 y.o. male with a hx of HTN, HLD who presents for follow-up of CP/SOB. Nuclear stress normal. BNP 2.8. TTE with normal LVEF. Read as severe asymmetric basal septal hypertrophy but mild on my review.  He is reassured heart testing is normal.  He reports he is still getting short of breath.  Occurs about once per month.  Occurs with hot weather.  He does get intermittent chest pain still.  This occurs 1-2 times per week.  Reports his pressure and a burning sensation after eating.  He reports that he does take Protonix but takes this with food.  He used to take it prior to eating but he stopped doing that.  I counseled him about how to effectively take his medication.  He will do that.  Blood pressure today is 172/94.  He is only on bisoprolol.  Most recent LDL 59 on Crestor 5 mg daily.  I did discuss with him that he will need to take a second blood pressure medication.  Problem List 1. HTN 2. HLD -total cholesterol 133, triglycerides 56, HDL 62, LDL 59 3. CP/SOB -normal MPI, echo with normal EF and mild basal septal hypertrophy on my read  -BNP 2.8  Past Medical History: Past Medical History:  Diagnosis Date  . Chest pain    NON CARDIAC  . Hyperlipidemia   . Hypertension     Past Surgical History: Past Surgical History:  Procedure Laterality Date  . CYSTECTOMY     LEFT WRIST    Current Medications: Current Meds  Medication Sig  . aspirin 81 MG EC tablet Take 81 mg by mouth daily.  . bisoprolol (ZEBETA) 5 MG tablet PO 1x daily     Allergies:    Patient has no known allergies.   Social History: Social History   Socioeconomic History  . Marital  status: Married    Spouse name: Not on file  . Number of children: Not on file  . Years of education: Not on file  . Highest education level: Not on file  Occupational History  . Not on file  Tobacco Use  . Smoking status: Former Smoker    Years: 10.00  . Smokeless tobacco: Never Used  Substance and Sexual Activity  . Alcohol use: Not Currently  . Drug use: No  . Sexual activity: Not on file  Other Topics Concern  . Not on file  Social History Narrative  . Not on file   Social Determinants of Health   Financial Resource Strain:   . Difficulty of Paying Living Expenses:   Food Insecurity:   . Worried About Programme researcher, broadcasting/film/video in the Last Year:   . Barista in the Last Year:   Transportation Needs:   . Freight forwarder (Medical):   Marland Kitchen Lack of Transportation (Non-Medical):   Physical Activity:   . Days of Exercise per Week:   . Minutes of Exercise per Session:   Stress:   . Feeling of Stress :   Social Connections:   . Frequency of Communication with Friends and Family:   . Frequency of  Social Gatherings with Friends and Family:   . Attends Religious Services:   . Active Member of Clubs or Organizations:   . Attends Banker Meetings:   Marland Kitchen Marital Status:      Family History: The patient's family history includes Diabetes in his mother; Heart attack in his brother and father; Heart disease in his mother; Hypertension in his mother; Pneumonia in his brother; Stroke in his father and mother.  ROS:   All other ROS reviewed and negative. Pertinent positives noted in the HPI.     EKGs/Labs/Other Studies Reviewed:   The following studies were personally reviewed by me today:  Lexiscan nuclear MPI 01/06/2020: normal TTE 12/28/2019: Echo with EF 60-65% and mild basal septal hypertrophy on my review 1. Left ventricular ejection fraction, by estimation, is 60 to 65%. The  left ventricle has normal function. The left ventricle has no regional  wall  motion abnormalities. 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  2. Right ventricular systolic function is normal. The right ventricular  size is normal. There is moderately elevated pulmonary artery systolic  pressure.  3. The mitral valve is normal in structure and function. No evidence of  mitral valve regurgitation. No evidence of mitral stenosis.  4. The aortic valve is tricuspid. Aortic valve regurgitation is moderate.  Mild to moderate aortic valve sclerosis/calcification is present, without  any evidence of aortic stenosis.  5. The inferior vena cava is normal in size with greater than 50%  respiratory variability, suggesting right atrial pressure of 3 mmHg.  6. Aortic dilatation noted. 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   Recent Labs: 12/27/2019: BNP 2.8   Recent Lipid Panel    Component Value Date/Time   CHOL (H) 05/31/2009 0335    214        ATP III CLASSIFICATION:  <200     mg/dL   Desirable  161-096  mg/dL   Borderline High  >=045    mg/dL   High          TRIG 409 05/31/2009 0335   HDL 48 05/31/2009 0335   CHOLHDL 4.5 05/31/2009 0335   VLDL 25 05/31/2009 0335   LDLCALC (H) 05/31/2009 0335    141        Total Cholesterol/HDL:CHD Risk Coronary Heart Disease Risk Table                     Men   Women  1/2 Average Risk   3.4   3.3  Average Risk       5.0   4.4  2 X Average Risk   9.6   7.1  3 X Average Risk  23.4   11.0        Use the calculated Patient Ratio above and the CHD Risk Table to determine the patient's CHD Risk.        ATP III CLASSIFICATION (LDL):  <100     mg/dL   Optimal  811-914  mg/dL   Near or Above                    Optimal  130-159  mg/dL   Borderline  782-956  mg/dL   High  >213     mg/dL   Very High    Physical Exam:   VS:  BP (!) 172/94   Pulse 86   Ht 5\' 9"  (  1.753 m)   Wt 248 lb (112.5 kg)   SpO2 96%   BMI 36.62 kg/m    Wt  Readings from Last 3 Encounters:  01/24/20 248 lb (112.5 kg)  01/06/20 249 lb (112.9 kg)  12/27/19 249 lb (112.9 kg)    General: Well nourished, well developed, in no acute distress Heart: Atraumatic, normal size  Eyes: PEERLA, EOMI  Neck: Supple, no JVD Endocrine: No thryomegaly Cardiac: Normal S1, S2; RRR; no murmurs, rubs, or gallops Lungs: Clear to auscultation bilaterally, no wheezing, rhonchi or rales  Abd: Soft, nontender, no hepatomegaly  Ext: No edema, pulses 2+ Musculoskeletal: No deformities, BUE and BLE strength normal and equal Skin: Warm and dry, no rashes   Neuro: Alert and oriented to person, place, time, and situation, CNII-XII grossly intact, no focal deficits  Psych: Normal mood and affect   ASSESSMENT:   Eddison Searls is a 69 y.o. male who presents for the following: 1. SOB (shortness of breath) on exertion   2. Other chest pain   3. Essential hypertension   4. Mixed hyperlipidemia     PLAN:   1. SOB (shortness of breath) on exertion -Symptoms seem to be improving.  Likely due to deconditioning.  Echocardiogram really unremarkable.  There was mention of severe basal septal hypertrophy by my review this is mild.  No LVOT outflow tract obstruction.  No murmurs to suggest hypertrophic dermopathy.  Nuclear stress test was normal without any evidence of obstructive CAD.  2. Other chest pain -Suspect this is GERD related.  He will take Protonix 30 minutes before he eats anything 8 ounces of water.  He was taking this with pain.  I think he was taking incorrectly.  We will see how he does with this new recommendation.  3. Essential hypertension -Blood pressure not controlled today.  On bisoprolol 5 mg daily.  We will add amlodipine 10 mg daily.  We will see him back in 3 months for further assessment.  4. Mixed hyperlipidemia -Most recent LDL cholesterol 59.  We will continue Crestor 20 mg daily.  Disposition: Return in about 3 months (around  04/25/2020).  Medication Adjustments/Labs and Tests Ordered: Current medicines are reviewed at length with the patient today.  Concerns regarding medicines are outlined above.  No orders of the defined types were placed in this encounter.  Meds ordered this encounter  Medications  . amLODipine (NORVASC) 10 MG tablet    Sig: Take 1 tablet (10 mg total) by mouth daily.    Dispense:  180 tablet    Refill:  3    Patient Instructions  Medication Instructions:  Start Amlodipine 10 mg daily   *If you need a refill on your cardiac medications before your next appointment, please call your pharmacy*   Follow-Up: At [facility], you and your health needs are our priority.  As part of our continuing mission to provide you with exceptional heart care, we have created designated Provider Care Teams.  These Care Teams include your primary Cardiologist (physician) and Advanced Practice Providers (APPs -  Physician Assistants and Nurse Practitioners) who all work together to provide you with the care you need, when you need it.  We recommend signing up for the patient portal called "MyChart".  Sign up information is provided on this After Visit Summary.  MyChart is used to connect with patients for Virtual Visits (Telemedicine).  Patients are able to view lab/test results, encounter notes, upcoming appointments, etc.  Non-urgent messages can be sent to  your provider as well.   To learn more about what you can do with MyChart, go to [email or web address].    Your next appointment:   3 month(s)  The format for your next appointment:   In Person  Provider:   Lennie Odor, MD       Time Spent with Patient: I have spent a total of 35 minutes with patient reviewing hospital notes, telemetry, EKGs, labs and examining the patient as well as establishing an assessment and plan that was discussed with the patient.  > 50% of time was spent in direct patient care.  Signed, Lenna Gilford. Flora Lipps,  MD [facility]  [address] [place], [place] [address] [phone]  01/24/2020 11:23 AM

## 2020-01-24 ENCOUNTER — Encounter: Payer: Self-pay | Admitting: Cardiovascular Disease

## 2020-01-24 ENCOUNTER — Other Ambulatory Visit: Payer: Self-pay

## 2020-01-24 ENCOUNTER — Ambulatory Visit: Payer: Medicare PPO | Admitting: Cardiovascular Disease

## 2020-01-24 VITALS — BP 172/94 | HR 86 | Ht 69.0 in | Wt 248.0 lb

## 2020-01-24 DIAGNOSIS — R0602 Shortness of breath: Secondary | ICD-10-CM

## 2020-01-24 DIAGNOSIS — R0789 Other chest pain: Secondary | ICD-10-CM

## 2020-01-24 DIAGNOSIS — I10 Essential (primary) hypertension: Secondary | ICD-10-CM | POA: Diagnosis not present

## 2020-01-24 DIAGNOSIS — E782 Mixed hyperlipidemia: Secondary | ICD-10-CM

## 2020-01-24 MED ORDER — AMLODIPINE BESYLATE 10 MG PO TABS: 10.0000 mg | ORAL_TABLET | Freq: Every day | ORAL | 3 refills | Status: DC

## 2020-01-24 NOTE — Patient Instructions (Signed)
Medication Instructions:  Start Amlodipine 10 mg daily   *If you need a refill on your cardiac medications before your next appointment, please call your pharmacy*   Follow-Up: At [facility], you and your health needs are our priority.  As part of our continuing mission to provide you with exceptional heart care, we have created designated Provider Care Teams.  These Care Teams include your primary Cardiologist (physician) and Advanced Practice Providers (APPs -  Physician Assistants and Nurse Practitioners) who all work together to provide you with the care you need, when you need it.  We recommend signing up for the patient portal called "MyChart".  Sign up information is provided on this After Visit Summary.  MyChart is used to connect with patients for Virtual Visits (Telemedicine).  Patients are able to view lab/test results, encounter notes, upcoming appointments, etc.  Non-urgent messages can be sent to your provider as well.   To learn more about what you can do with MyChart, go to [email or web address].    Your next appointment:   3 month(s)  The format for your next appointment:   In Person  Provider:   Lennie Odor, MD

## 2020-02-03 DIAGNOSIS — I10 Essential (primary) hypertension: Secondary | ICD-10-CM | POA: Diagnosis not present

## 2020-03-02 DIAGNOSIS — I10 Essential (primary) hypertension: Secondary | ICD-10-CM | POA: Diagnosis not present

## 2020-03-02 DIAGNOSIS — K219 Gastro-esophageal reflux disease without esophagitis: Secondary | ICD-10-CM | POA: Diagnosis not present

## 2020-03-31 DIAGNOSIS — L918 Other hypertrophic disorders of the skin: Secondary | ICD-10-CM | POA: Diagnosis not present

## 2020-04-05 ENCOUNTER — Ambulatory Visit: Payer: Medicare PPO | Admitting: Cardiovascular Disease

## 2020-04-18 DIAGNOSIS — I10 Essential (primary) hypertension: Secondary | ICD-10-CM | POA: Diagnosis not present

## 2020-04-18 DIAGNOSIS — E7800 Pure hypercholesterolemia, unspecified: Secondary | ICD-10-CM | POA: Diagnosis not present

## 2020-04-18 DIAGNOSIS — Z125 Encounter for screening for malignant neoplasm of prostate: Secondary | ICD-10-CM | POA: Diagnosis not present

## 2020-04-25 DIAGNOSIS — R220 Localized swelling, mass and lump, head: Secondary | ICD-10-CM | POA: Diagnosis not present

## 2020-04-25 DIAGNOSIS — Z0001 Encounter for general adult medical examination with abnormal findings: Secondary | ICD-10-CM | POA: Diagnosis not present

## 2020-04-25 DIAGNOSIS — R7309 Other abnormal glucose: Secondary | ICD-10-CM | POA: Diagnosis not present

## 2020-04-25 DIAGNOSIS — I119 Hypertensive heart disease without heart failure: Secondary | ICD-10-CM | POA: Diagnosis not present

## 2020-04-25 DIAGNOSIS — I10 Essential (primary) hypertension: Secondary | ICD-10-CM | POA: Diagnosis not present

## 2020-04-25 DIAGNOSIS — R351 Nocturia: Secondary | ICD-10-CM | POA: Diagnosis not present

## 2020-06-01 DIAGNOSIS — I10 Essential (primary) hypertension: Secondary | ICD-10-CM | POA: Diagnosis not present

## 2020-06-01 DIAGNOSIS — R7301 Impaired fasting glucose: Secondary | ICD-10-CM | POA: Diagnosis not present

## 2020-08-02 DIAGNOSIS — I10 Essential (primary) hypertension: Secondary | ICD-10-CM | POA: Diagnosis not present

## 2020-08-02 DIAGNOSIS — E7800 Pure hypercholesterolemia, unspecified: Secondary | ICD-10-CM | POA: Diagnosis not present

## 2020-08-02 DIAGNOSIS — R7309 Other abnormal glucose: Secondary | ICD-10-CM | POA: Diagnosis not present

## 2020-08-03 DIAGNOSIS — R7303 Prediabetes: Secondary | ICD-10-CM | POA: Diagnosis not present

## 2020-08-03 DIAGNOSIS — E7800 Pure hypercholesterolemia, unspecified: Secondary | ICD-10-CM | POA: Diagnosis not present

## 2020-08-03 DIAGNOSIS — R7301 Impaired fasting glucose: Secondary | ICD-10-CM | POA: Diagnosis not present

## 2020-08-03 DIAGNOSIS — I10 Essential (primary) hypertension: Secondary | ICD-10-CM | POA: Diagnosis not present

## 2020-12-05 DIAGNOSIS — E7800 Pure hypercholesterolemia, unspecified: Secondary | ICD-10-CM | POA: Diagnosis not present

## 2020-12-05 DIAGNOSIS — R7301 Impaired fasting glucose: Secondary | ICD-10-CM | POA: Diagnosis not present

## 2020-12-05 DIAGNOSIS — I10 Essential (primary) hypertension: Secondary | ICD-10-CM | POA: Diagnosis not present

## 2020-12-05 DIAGNOSIS — R7303 Prediabetes: Secondary | ICD-10-CM | POA: Diagnosis not present

## 2020-12-07 DIAGNOSIS — E7800 Pure hypercholesterolemia, unspecified: Secondary | ICD-10-CM | POA: Diagnosis not present

## 2020-12-07 DIAGNOSIS — E119 Type 2 diabetes mellitus without complications: Secondary | ICD-10-CM | POA: Diagnosis not present

## 2020-12-07 DIAGNOSIS — I10 Essential (primary) hypertension: Secondary | ICD-10-CM | POA: Diagnosis not present

## 2021-03-06 DIAGNOSIS — E119 Type 2 diabetes mellitus without complications: Secondary | ICD-10-CM | POA: Diagnosis not present

## 2021-03-06 DIAGNOSIS — E7800 Pure hypercholesterolemia, unspecified: Secondary | ICD-10-CM | POA: Diagnosis not present

## 2021-03-08 DIAGNOSIS — I10 Essential (primary) hypertension: Secondary | ICD-10-CM | POA: Diagnosis not present

## 2021-03-08 DIAGNOSIS — K219 Gastro-esophageal reflux disease without esophagitis: Secondary | ICD-10-CM | POA: Diagnosis not present

## 2021-03-08 DIAGNOSIS — E119 Type 2 diabetes mellitus without complications: Secondary | ICD-10-CM | POA: Diagnosis not present

## 2021-03-08 DIAGNOSIS — R0602 Shortness of breath: Secondary | ICD-10-CM | POA: Diagnosis not present

## 2021-03-08 DIAGNOSIS — E7800 Pure hypercholesterolemia, unspecified: Secondary | ICD-10-CM | POA: Diagnosis not present

## 2021-03-13 ENCOUNTER — Other Ambulatory Visit: Payer: Self-pay | Admitting: Internal Medicine

## 2021-03-13 DIAGNOSIS — E7800 Pure hypercholesterolemia, unspecified: Secondary | ICD-10-CM

## 2021-03-15 ENCOUNTER — Encounter: Payer: Self-pay | Admitting: Pulmonary Disease

## 2021-03-15 ENCOUNTER — Ambulatory Visit: Payer: Medicare PPO | Admitting: Pulmonary Disease

## 2021-03-15 ENCOUNTER — Ambulatory Visit (INDEPENDENT_AMBULATORY_CARE_PROVIDER_SITE_OTHER): Payer: Medicare PPO

## 2021-03-15 ENCOUNTER — Other Ambulatory Visit: Payer: Self-pay

## 2021-03-15 VITALS — BP 118/68 | HR 86 | Temp 98.3°F | Ht 70.5 in | Wt 241.2 lb

## 2021-03-15 DIAGNOSIS — I272 Pulmonary hypertension, unspecified: Secondary | ICD-10-CM

## 2021-03-15 DIAGNOSIS — G4733 Obstructive sleep apnea (adult) (pediatric): Secondary | ICD-10-CM | POA: Diagnosis not present

## 2021-03-15 DIAGNOSIS — R0602 Shortness of breath: Secondary | ICD-10-CM

## 2021-03-15 NOTE — Addendum Note (Signed)
Addended by: Wynona Meals on: 03/15/2021 03:37 PM   Modules accepted: Orders

## 2021-03-15 NOTE — Patient Instructions (Addendum)
Obtain chest x-ray today  Schedule pulmonary function test against next visit just a little  Echocardiogram for pulmonary hypertension  Schedule for home sleep study  Look up information for pulmonary hypertension Look up information about obstructive sleep apnea  I will see you back in 6 weeks  Call with significant concerns Pulmonary Hypertension Pulmonary hypertension is a long-term (chronic) condition in which there is high blood pressure in the arteries in the lungs (pulmonary arteries). This condition occurs when pulmonary arteries become narrow and tight, making it harder for blood to flow through the lungs. This in turn makes the heart work harder to pump blood through the lungs, making it harder for you to breathe. Over time, pulmonary hypertension can weaken and damage the heart muscle, specifically the right side of the heart. Pulmonary hypertension is a serious condition that can be life-threatening. What are the causes? This condition may be caused by different medical conditions. It can be categorized by cause into five groups:  Group 1: Pulmonary hypertension that is caused by abnormal growth of small blood vessels in the lungs (pulmonary arterial hypertension). The abnormal blood vessel growth may have no known cause, or it may be: ? Passed from parent to child (hereditary). ? Caused by another disease, such as a connective tissue disease (including lupus or scleroderma), congenital heart disease, liver disease, or HIV. ? Caused by certain medicines or poisons (toxins).  Group 2: Pulmonary hypertension that is caused by weakness of the left chamber of the heart (left ventricle) or heart valve disease.  Group 3: Pulmonary hypertension that is caused by lung disease or low oxygen levels. Causes in this group include: ? Emphysema or chronic obstructive pulmonary disease (COPD). ? Untreated sleep apnea. ? Pulmonary fibrosis. ? Long-term exposure to high altitudes in certain  people who may already be at higher risk for pulmonary hypertension.  Group 4: Pulmonary hypertension that is caused by blood clots in the lungs (pulmonary emboli).  Group 5: Other causes of pulmonary hypertension, such as sickle cell anemia, sarcoidosis, tumors pressing on the pulmonary arteries, and various other diseases. What are the signs or symptoms? Symptoms of this condition include:  Shortness of breath. You may notice shortness of breath with: ? Activity, such as walking. ? Minimal activity, such as getting dressed. ? No activity, like when you are sitting still.  A cough. Sometimes, bloody mucus from the lungs may be coughed up (hemoptysis).  Tiredness and fatigue.  Dizziness, lightheadedness, or fainting, especially with physical activity.  Rapid heartbeat, or feeling your heart flutter or skip a beat (palpitations).  Veins in the neck getting larger.  Swelling of the lower legs, abdomen, or both.  Bluish color of the lips and fingertips.  Chest pain or tightness in the chest.  Abdominal pain, especially in the upper abdomen. How is this diagnosed? This condition may be diagnosed based on one or more of the following tests:  Chest X-ray.  Blood tests.  CT scan.  Pulmonary function test. This test measures how much air your lungs can hold. It also tests how well air moves in and out of your lungs.  6-minute walk test. This tests how severe your condition is in relation to your activity levels.  Electrocardiogram (ECG). This test records the electrical impulses of the heart.  Echocardiogram. This test uses sound waves (ultrasound) to produce an image of the heart.  Cardiac catheterization. This is a procedure in which a thin tube (catheter) is passed into the pulmonary artery and  used to test the pressure in your pulmonary artery and the right side of your heart.  Lung biopsy. This involves having a procedure to remove a small sample of lung tissue for  testing. This may help determine an underlying cause of your pulmonary hypertension. How is this treated? There is no cure for this condition, but treatment can help to relieve symptoms and slow the progress of the condition. Treatment may include:  Cardiac rehabilitation. This is a treatment program that includes exercise training, education, and counseling to help you get stronger and return to an active lifestyle.  Oxygen therapy.  Medicines that: ? Lower blood pressure. ? Relax (dilate) the pulmonary blood vessels. ? Help the heart beat more efficiently and pump more blood. ? Help the body get rid of extra fluid (diuretics). ? Thin the blood in order to prevent blood clots in the lungs.  Lung surgery to relieve pressure on the heart, for severe cases that do not respond to medical treatment.  Heart-lung transplant, or lung transplant. This may be done in very severe cases. Follow these instructions at home: Eating and drinking  Eat a healthy diet that includes plenty of fresh fruits and vegetables, whole grains, and beans.  Limit your salt (sodium) intake to less than 2,300 mg a day.   Lifestyle  Do not use any products that contain nicotine or tobacco, such as cigarettes and e-cigarettes. If you need help quitting, ask your health care provider.  Avoid secondhand smoke. Activity  Get plenty of rest.  Exercise as directed. Talk with your health care provider about what type of exercise is safe for you.  Avoid hot tubs and saunas.  Avoid high altitudes. General instructions  Take over-the-counter and prescription medicines only as told by your health care provider. Do not change or stop medicines without checking with your health care provider.  Stay up to date on your vaccines, especially yearly flu (influenza) and pneumonia vaccines.  If you are a woman of child-bearing age, avoid becoming pregnant. Talk with your health care provider about birth control.  Consider  ways to get support for anxiety and stress of living with pulmonary hypertension. Talk with your health care provider about support groups and online resources.  Use oxygen therapy at home as directed.  Keep track of your weight. Weight gain could be a sign that your condition is getting worse.  Keep all follow-up visits as told by your health care provider. This is important. Contact a health care provider if:  Your cough gets worse.  You have more shortness of breath than usual, or you start to have trouble doing activities that you could do before.  You need to use medicines or oxygen more frequently or in higher dosages than usual. Get help right away if:  You have severe shortness of breath.  You have chest pain or pressure.  You cough up blood.  You have swelling of your feet or legs that gets worse.  You have rapid weight gain over a period of 1-2 days.  Your medicines or oxygen do not provide relief. Summary  Pulmonary hypertension is a chronic condition in which there is high blood pressure in the arteries in the lungs (pulmonary arteries).  Pulmonary hypertension is a serious condition that can be life-threatening. It can be caused by a variety of illnesses.  Treatment may involve taking medicines and using oxygen therapy. Severe cases may require surgery or a transplant. This information is not intended to replace advice given  to you by your health care provider. Make sure you discuss any questions you have with your health care provider. Document Revised: 02/16/2020 Document Reviewed: 01/14/2017 Elsevier Patient Education  2021 [employer] Inc[employer]  Sleep Apnea Sleep apnea affects breathing during sleep. It causes breathing to stop for a short time or to become shallow. It can also increase the risk of:  Heart attack.  Stroke.  Being very overweight (obese).  Diabetes.  Heart failure.  Irregular heartbeat. The goal of treatment is to help you breathe normally  again. What are the causes? There are three kinds of sleep apnea:  Obstructive sleep apnea. This is caused by a blocked or collapsed airway.  Central sleep apnea. This happens when the brain does not send the right signals to the muscles that control breathing.  Mixed sleep apnea. This is a combination of obstructive and central sleep apnea. The most common cause of this condition is a collapsed or blocked airway. This can happen if:  Your throat muscles are too relaxed.  Your tongue and tonsils are too large.  You are overweight.  Your airway is too small.   What increases the risk?  Being overweight.  Smoking.  Having a small airway.  Being older.  Being male.  Drinking alcohol.  Taking medicines to calm yourself (sedatives or tranquilizers).  Having family members with the condition. What are the signs or symptoms?  Trouble staying asleep.  Being sleepy or tired during the day.  Getting angry a lot.  Loud snoring.  Headaches in the morning.  Not being able to focus your mind (concentrate).  Forgetting things.  Less interest in sex.  Mood swings.  Personality changes.  Feelings of sadness (depression).  Waking up a lot during the night to pee (urinate).  Dry mouth.  Sore throat. How is this diagnosed?  Your medical history.  A physical exam.  A test that is done when you are sleeping (sleep study). The test is most often done in a sleep lab but may also be done at home. How is this treated?  Sleeping on your side.  Using a medicine to get rid of mucus in your nose (decongestant).  Avoiding the use of alcohol, medicines to help you relax, or certain pain medicines (narcotics).  Losing weight, if needed.  Changing your diet.  Not smoking.  Using a machine to open your airway while you sleep, such as: ? An oral appliance. This is a mouthpiece that shifts your lower jaw forward. ? A CPAP device. This device blows air through a mask  when you breathe out (exhale). ? An EPAP device. This has valves that you put in each nostril. ? A BPAP device. This device blows air through a mask when you breathe in (inhale) and breathe out.  Having surgery if other treatments do not work. It is important to get treatment for sleep apnea. Without treatment, it can lead to:  High blood pressure.  Coronary artery disease.  In men, not being able to have an erection (impotence).  Reduced thinking ability.   Follow these instructions at home: Lifestyle  Make changes that your doctor recommends.  Eat a healthy diet.  Lose weight if needed.  Avoid alcohol, medicines to help you relax, and some pain medicines.  Do not use any products that contain nicotine or tobacco, such as cigarettes, e-cigarettes, and chewing tobacco. If you need help quitting, ask your doctor. General instructions  Take over-the-counter and prescription medicines only as told by your  doctor.  If you were given a machine to use while you sleep, use it only as told by your doctor.  If you are having surgery, make sure to tell your doctor you have sleep apnea. You may need to bring your device with you.  Keep all follow-up visits as told by your doctor. This is important. Contact a doctor if:  The machine that you were given to use during sleep bothers you or does not seem to be working.  You do not get better.  You get worse. Get help right away if:  Your chest hurts.  You have trouble breathing in enough air.  You have an uncomfortable feeling in your back, arms, or stomach.  You have trouble talking.  One side of your body feels weak.  A part of your face is hanging down. These symptoms may be an emergency. Do not wait to see if the symptoms will go away. Get medical help right away. Call your local emergency services (911 in the [place] Do not drive yourself to the hospital. Summary  This condition affects breathing during sleep.  The  most common cause is a collapsed or blocked airway.  The goal of treatment is to help you breathe normally while you sleep. This information is not intended to replace advice given to you by your health care provider. Make sure you discuss any questions you have with your health care provider. Document Revised: 08/07/2018 Document Reviewed: 06/16/2018 Elsevier Patient Education  2021 [employer]

## 2021-03-15 NOTE — Progress Notes (Signed)
[name] Holt    [id number]    May 19, 1951  Primary Care Physician:Pharr, Zollie Beckers, MD  Referring Physician: Merri Brunette, MD 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 amLODipine (NORVASC) 10 MG tablet Take 1 tablet (10 mg total) by mouth daily.  Marland Kitchen aspirin 81 MG EC tablet Take 81 mg by mouth daily.  . cetirizine (ZYRTEC) 10 MG tablet Take 10 mg by mouth daily.  . famotidine (PEPCID) 20 MG tablet Take 20 mg by mouth at bedtime.  Marland Kitchen olmesartan (BENICAR) 20 MG tablet Take 20 mg by mouth daily.  . pantoprazole (PROTONIX) 40 MG tablet   . rosuvastatin (CRESTOR) 20 MG tablet Take 1 tablet (20 mg total) by mouth daily. (Patient taking differently: Take 10 mg by mouth daily.)  . bisoprolol (ZEBETA) 5 MG tablet PO 1x daily (Patient not taking: Reported on 03/15/2021)  . XIIDRA 5 % SOLN Instill 1 drop into both eyes twice a day (Patient not taking: Reported on 03/15/2021)   No facility-administered encounter medications on file as of 03/15/2021.    Allergies as of  03/15/2021  . (No Known Allergies)    Past Medical History:  Diagnosis Date  . Allergic rhinitis   . Chest pain    NON CARDIAC  . Hyperlipidemia   . Hypertension   . Sleep apnea     Past Surgical History:  Procedure Laterality Date  . CYSTECTOMY     LEFT WRIST    Family History  Problem Relation Age of Onset  . Hypertension Mother   . Stroke Mother   . Diabetes Mother   . Heart disease Mother   . Stroke Father   . Heart attack Father   . Pneumonia Brother   . Heart attack Brother     Social History   Socioeconomic History  . Marital status: Married    Spouse name: Not on file  . Number of children: Not on file  . Years of education: Not on file  . Highest education level: Not on file  Occupational History  . Not on file  Tobacco Use  . Smoking status: Former Smoker    Years: 15.00    Types: Cigarettes    Quit date: 2000    Years since quitting: 22.3  . Smokeless tobacco: Never Used  Vaping Use  . Vaping Use: Never used  Substance and Sexual Activity  . Alcohol use: Not Currently  . Drug use: No  . Sexual activity: Not on file  Other Topics Concern  . Not on file  Social History Narrative  .  Not on file   Social Determinants of Health   Financial Resource Strain: Not on file  Food Insecurity: Not on file  Transportation Needs: Not on file  Physical Activity: Not on file  Stress: Not on file  Social Connections: Not on file  Intimate Partner Violence: Not on file    Review of Systems  Constitutional: Positive for fatigue.  Respiratory: Positive for shortness of breath.   Cardiovascular: Positive for chest pain.  Psychiatric/Behavioral: Positive for sleep disturbance.    Vitals:   03/15/21 1350  BP: 118/68  Pulse: 86  Temp: 98.3 F (36.8 C)  SpO2: 98%     Physical Exam Constitutional:      Appearance: He is obese.  HENT:     Head: Normocephalic.     Nose: No congestion or rhinorrhea.     Mouth/Throat:     Mouth: Mucous  membranes are moist.     Comments: Mallampati 4, macroglossia Eyes:     General:        Right eye: No discharge.        Left eye: No discharge.     Pupils: Pupils are equal, round, and reactive to light.  Cardiovascular:     Rate and Rhythm: Normal rate and regular rhythm.     Heart sounds: No murmur heard. No friction rub.  Pulmonary:     Effort: No respiratory distress.     Breath sounds: No stridor. No wheezing or rhonchi.  Musculoskeletal:     Cervical back: No rigidity or tenderness.  Neurological:     Mental Status: He is alert.  Psychiatric:        Mood and Affect: Mood normal.    Results of the Epworth flowsheet 03/15/2021  Sitting and reading 2  Watching TV 2  Sitting, inactive in a public place (e.g. a theatre or a meeting) 2  As a passenger in a car for an hour without a break 3  Lying down to rest in the afternoon when circumstances permit 0  Sitting and talking to someone 1  Sitting quietly after a lunch without alcohol 3  In a car, while stopped for a few minutes in traffic 0  Total score 13     Data Reviewed: Echocardiogram February 2021 shows severe left ventricular hypertrophy, normal ejection fraction, moderately elevated pulmonary artery systolic pressure   Referral notes reviewed  Assessment:  Shortness of breath  Obstructive sleep apnea -Untreated at present  Moderate pulmonary hypertension based on recent echocardiogram  Past history of smoking  Pathophysiology of sleep disordered breathing and the relationship of pulmonary hypertension discussed with the patient Treatment options discussed with the patient  Plan/Recommendations: Schedule patient for chest x-ray Schedule him for repeat echocardiogram Schedule him for pulmonary function test  Repeat home sleep study  Importance of treating his sleep disordered breathing discussed with him  If echocardiogram confirms pulmonary hypertension, will need right heart catheterization to  assess pulmonary pressures  I spent 45 minutes dedicated to the care of this patient on the date of this encounter to include previsit review of records, face-to-face time with the patient discussing conditions above, post visit ordering of testing, clinical documentation with electronic health record and communicated necessary findings to members of the patient's care team  Follow-up in 6 weeks  Encouraged to call with any significant concerns  Information material regarding pulmonary hypertension and obstructive sleep apnea provided  Virl Diamond MD  Pulmonary and Critical Care 03/15/2021, 2:01 PM  CC: Merri Brunette,  MD   

## 2021-03-29 DIAGNOSIS — I10 Essential (primary) hypertension: Secondary | ICD-10-CM | POA: Diagnosis not present

## 2021-04-11 ENCOUNTER — Ambulatory Visit (HOSPITAL_COMMUNITY)
Admission: RE | Admit: 2021-04-11 | Discharge: 2021-04-11 | Disposition: A | Discharge status: Home / Self Care | Payer: Medicare PPO | Source: Ambulatory Visit | Attending: Pulmonary Disease | Admitting: Pulmonary Disease

## 2021-04-11 ENCOUNTER — Other Ambulatory Visit: Payer: Self-pay

## 2021-04-11 DIAGNOSIS — E785 Hyperlipidemia, unspecified: Secondary | ICD-10-CM | POA: Insufficient documentation

## 2021-04-11 DIAGNOSIS — I272 Pulmonary hypertension, unspecified: Secondary | ICD-10-CM

## 2021-04-11 DIAGNOSIS — I700 Atherosclerosis of aorta: Secondary | ICD-10-CM | POA: Diagnosis not present

## 2021-04-11 DIAGNOSIS — I351 Nonrheumatic aortic (valve) insufficiency: Secondary | ICD-10-CM | POA: Insufficient documentation

## 2021-04-11 DIAGNOSIS — I10 Essential (primary) hypertension: Secondary | ICD-10-CM | POA: Insufficient documentation

## 2021-04-11 LAB — ECHOCARDIOGRAM COMPLETE
Area-P 1/2: 3.99 cm2
Calc EF: 63.5 %
P 1/2 time: 661 msec
S' Lateral: 3 cm
Single Plane A2C EF: 60.9 %
Single Plane A4C EF: 67.1 %

## 2021-04-11 NOTE — Progress Notes (Signed)
  Echocardiogram 2D Echocardiogram has been performed.  [name] Holt 04/11/2021, 1:43 PM

## 2021-04-18 ENCOUNTER — Other Ambulatory Visit: Payer: Self-pay

## 2021-04-18 ENCOUNTER — Ambulatory Visit: Payer: Medicare PPO

## 2021-04-18 DIAGNOSIS — G4733 Obstructive sleep apnea (adult) (pediatric): Secondary | ICD-10-CM

## 2021-04-23 ENCOUNTER — Other Ambulatory Visit: Payer: Self-pay | Admitting: Cardiovascular Disease

## 2021-04-23 ENCOUNTER — Telehealth: Payer: Self-pay | Admitting: Pulmonary Disease

## 2021-04-23 DIAGNOSIS — G4733 Obstructive sleep apnea (adult) (pediatric): Secondary | ICD-10-CM

## 2021-04-23 NOTE — Telephone Encounter (Signed)
Call patient  Sleep study result  Date of study: 04/18/2021  Impression: Severe obstructive sleep apnea Moderate oxygen saturations  Recommendation: DME referral  Recommend CPAP therapy for severe obstructive sleep apnea  Auto titrating CPAP with pressure settings of 5-20 will be appropriate  Encourage weight loss measures  Follow-up in the office 4 to 6 weeks following initiation of treatment

## 2021-04-30 DIAGNOSIS — E7800 Pure hypercholesterolemia, unspecified: Secondary | ICD-10-CM | POA: Diagnosis not present

## 2021-04-30 DIAGNOSIS — I272 Pulmonary hypertension, unspecified: Secondary | ICD-10-CM | POA: Diagnosis not present

## 2021-04-30 DIAGNOSIS — Z0001 Encounter for general adult medical examination with abnormal findings: Secondary | ICD-10-CM | POA: Diagnosis not present

## 2021-04-30 DIAGNOSIS — I10 Essential (primary) hypertension: Secondary | ICD-10-CM | POA: Diagnosis not present

## 2021-04-30 DIAGNOSIS — Z1212 Encounter for screening for malignant neoplasm of rectum: Secondary | ICD-10-CM | POA: Diagnosis not present

## 2021-04-30 DIAGNOSIS — R413 Other amnesia: Secondary | ICD-10-CM | POA: Diagnosis not present

## 2021-04-30 DIAGNOSIS — I340 Nonrheumatic mitral (valve) insufficiency: Secondary | ICD-10-CM | POA: Diagnosis not present

## 2021-04-30 DIAGNOSIS — H9201 Otalgia, right ear: Secondary | ICD-10-CM | POA: Diagnosis not present

## 2021-04-30 DIAGNOSIS — G4733 Obstructive sleep apnea (adult) (pediatric): Secondary | ICD-10-CM | POA: Diagnosis not present

## 2021-04-30 DIAGNOSIS — I119 Hypertensive heart disease without heart failure: Secondary | ICD-10-CM | POA: Diagnosis not present

## 2021-04-30 DIAGNOSIS — K219 Gastro-esophageal reflux disease without esophagitis: Secondary | ICD-10-CM | POA: Diagnosis not present

## 2021-04-30 NOTE — Telephone Encounter (Signed)
ATC, left VM for callback. 

## 2021-05-01 ENCOUNTER — Other Ambulatory Visit (HOSPITAL_COMMUNITY): Payer: Medicare PPO

## 2021-05-01 NOTE — Telephone Encounter (Signed)
I called and spoke with patient regarding HST results and Dr. Agustina Caroli. Patient verbalized understanding and is agreeable to CPAP machine. I sent in order for new CPAP with stated pressures and notified patient that DME company will be in contact next. Patient verbalized understanding, nothing further needed.

## 2021-05-04 ENCOUNTER — Encounter: Payer: Self-pay | Admitting: Pulmonary Disease

## 2021-05-04 ENCOUNTER — Ambulatory Visit (INDEPENDENT_AMBULATORY_CARE_PROVIDER_SITE_OTHER): Payer: Medicare PPO | Admitting: Pulmonary Disease

## 2021-05-04 ENCOUNTER — Ambulatory Visit: Payer: Medicare PPO | Admitting: Pulmonary Disease

## 2021-05-04 ENCOUNTER — Other Ambulatory Visit: Payer: Self-pay

## 2021-05-04 VITALS — BP 122/68 | HR 59 | Temp 97.3°F | Ht 72.0 in | Wt 236.4 lb

## 2021-05-04 DIAGNOSIS — G4733 Obstructive sleep apnea (adult) (pediatric): Secondary | ICD-10-CM | POA: Diagnosis not present

## 2021-05-04 DIAGNOSIS — R0602 Shortness of breath: Secondary | ICD-10-CM | POA: Diagnosis not present

## 2021-05-04 LAB — PULMONARY FUNCTION TEST
DL/VA % pred: 113 %
DL/VA: 4.66 ml/min/mmHg/L
DLCO cor % pred: 105 %
DLCO cor: 27.78 ml/min/mmHg
DLCO unc % pred: 105 %
DLCO unc: 27.78 ml/min/mmHg
FEF 25-75 Post: 3.33 L/sec
FEF 25-75 Pre: 3.5 L/sec
FEF2575-%Change-Post: -4 %
FEF2575-%Pred-Post: 127 %
FEF2575-%Pred-Pre: 133 %
FEV1-%Change-Post: -2 %
FEV1-%Pred-Post: 100 %
FEV1-%Pred-Pre: 102 %
FEV1-Post: 2.97 L
FEV1-Pre: 3.05 L
FEV1FVC-%Change-Post: 4 %
FEV1FVC-%Pred-Pre: 107 %
FEV6-%Change-Post: -6 %
FEV6-%Pred-Post: 92 %
FEV6-%Pred-Pre: 98 %
FEV6-Post: 3.43 L
FEV6-Pre: 3.66 L
FEV6FVC-%Change-Post: 0 %
FEV6FVC-%Pred-Post: 104 %
FEV6FVC-%Pred-Pre: 103 %
FVC-%Change-Post: -7 %
FVC-%Pred-Post: 88 %
FVC-%Pred-Pre: 94 %
FVC-Post: 3.43 L
FVC-Pre: 3.69 L
Post FEV1/FVC ratio: 87 %
Post FEV6/FVC ratio: 100 %
Pre FEV1/FVC ratio: 82 %
Pre FEV6/FVC Ratio: 99 %
RV % pred: 68 %
RV: 1.64 L
TLC % pred: 89 %
TLC: 6.27 L

## 2021-05-04 NOTE — Patient Instructions (Signed)
You have severe obstructive sleep apnea -This needs treated with CPAP to prevent harm down the road -We will contact the medical supply company to set you up with CPAP  Your breathing study was within normal limits  Your echocardiogram was within normal limits  Continue weight loss efforts  I will see you back in about 3 months  Call with significant concerns  Sleep Apnea Sleep apnea affects breathing during sleep. It causes breathing to stop for 10 seconds or more, or to become shallow. People with sleep apnea usually snoreloudly. It can also increase the risk of: Heart attack. Stroke. Being very overweight (obese). Diabetes. Heart failure. Irregular heartbeat. High blood pressure. The goal of treatment is to help you breathe normally again. What are the causes?  The most common cause of this condition is a collapsed or blocked airway. There are three kinds of sleep apnea: Obstructive sleep apnea. This is caused by a blocked or collapsed airway. Central sleep apnea. This happens when the brain does not send the right signals to the muscles that control breathing. Mixed sleep apnea. This is a combination of obstructive and central sleep apnea. What increases the risk? Being overweight. Smoking. Having a small airway. Being older. Being male. Drinking alcohol. Taking medicines to calm yourself (sedatives or tranquilizers). Having family members with the condition. Having a tongue or tonsils that are larger than normal. What are the signs or symptoms? Trouble staying asleep. Loud snoring. Headaches in the morning. Waking up gasping. Dry mouth or sore throat in the morning. Being sleepy or tired during the day. If you are sleepy or tired during the day, you may also: Not be able to focus your mind (concentrate). Forget things. Get angry a lot and have mood swings. Feel sad (depressed). Have changes in your personality. Have less interest in sex, if you are  male. Be unable to have an erection, if you are male. How is this treated?  Sleeping on your side. Using a medicine to get rid of mucus in your nose (decongestant). Avoiding the use of alcohol, medicines to help you relax, or certain pain medicines (narcotics). Losing weight, if needed. Changing your diet. Quitting smoking. Using a machine to open your airway while you sleep, such as: An oral appliance. This is a mouthpiece that shifts your lower jaw forward. A CPAP device. This device blows air through a mask when you breathe out (exhale). An EPAP device. This has valves that you put in each nostril. A BPAP device. This device blows air through a mask when you breathe in (inhale) and breathe out. Having surgery if other treatments do not work. Follow these instructions at home: Lifestyle Make changes that your doctor recommends. Eat a healthy diet. Lose weight if needed. Avoid alcohol, medicines to help you relax, and some pain medicines. Do not smoke or use any products that contain nicotine or tobacco. If you need help quitting, ask your doctor. General instructions Take over-the-counter and prescription medicines only as told by your doctor. If you were given a machine to use while you sleep, use it only as told by your doctor. If you are having surgery, make sure to tell your doctor you have sleep apnea. You may need to bring your device with you. Keep all follow-up visits. Contact a doctor if: The machine that you were given to use during sleep bothers you or does not seem to be working. You do not get better. You get worse. Get help right away if: Your  chest hurts. You have trouble breathing in enough air. You have an uncomfortable feeling in your back, arms, or stomach. You have trouble talking. One side of your body feels weak. A part of your face is hanging down. These symptoms may be an emergency. Get help right away. Call your local emergency services (911 in the  [place] Do not wait to see if the symptoms will go away. Do not drive yourself to the hospital. Summary This condition affects breathing during sleep. The most common cause is a collapsed or blocked airway. The goal of treatment is to help you breathe normally while you sleep. This information is not intended to replace advice given to you by your health care provider. Make sure you discuss any questions you have with your healthcare provider. Document Revised: 09/29/2020 Document Reviewed: 09/29/2020 Elsevier Patient Education  2022 [employer].

## 2021-05-04 NOTE — Progress Notes (Signed)
Full PFT completed today ? ?

## 2021-05-04 NOTE — Progress Notes (Signed)
[name] Holt    [id number]    1951/07/30  Primary Care Physician:Pharr, Zollie Beckers, MD  Referring Physician: Merri Brunette, MD [address] [place],  [place] [address]  Chief complaint:   Follow-up for recent visit of shortness of breath and chest discomfort  HPI:  Shortness of breath about the same  Patient had a home sleep study performed showing severe obstructive sleep apnea with oxygen desaturations  Repeat echocardiogram shows normal right-sided pressures  History of sleep apnea Has not used the machine in years -Did have issues with the mask in the past  Shortness of breath is limiting Able to walk a mile Able to walk about 20-30 steps-stairs  Quit smoking over 20 years ago, half a pack a day  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 PATIENT MUST SCHEDULE APPOINTMENT FOR FUTURE REFILLS. 1ST ATTEMPT   aspirin 81 MG EC tablet Take 81 mg by mouth daily.   bisoprolol (ZEBETA) 5 MG tablet    cetirizine (ZYRTEC) 10 MG tablet Take 10 mg by mouth daily.   famotidine (PEPCID) 20 MG tablet Take 20 mg by mouth at bedtime.   olmesartan (BENICAR) 20 MG tablet Take 20 mg by mouth daily.   pantoprazole (PROTONIX) 40 MG tablet    rosuvastatin (CRESTOR) 20 MG tablet Take 1 tablet (20 mg total) by mouth daily. (Patient taking differently: Take 10 mg by mouth daily.)   XIIDRA 5 % SOLN    No facility-administered encounter medications on file as of 05/04/2021.    Allergies as of 05/04/2021   (No Known Allergies)    Past Medical History:   Diagnosis Date   Allergic rhinitis    Chest pain    NON CARDIAC   Hyperlipidemia    Hypertension    Sleep apnea     Past Surgical History:  Procedure Laterality Date   CYSTECTOMY     LEFT WRIST    Family History  Problem Relation Age of Onset   Hypertension Mother    Stroke Mother    Diabetes Mother    Heart disease Mother    Stroke Father    Heart attack Father    Pneumonia Brother    Heart attack Brother     Social History   Socioeconomic History   Marital status: Married    Spouse name: Not on file   Number of children: Not on file   Years of education: Not on file   Highest education level: Not on file  Occupational History   Not on file  Tobacco Use   Smoking status: Former    Years: 15.00    Pack years: 0.00    Types: Cigarettes    Quit date: 2000    Years since quitting: 22.5   Smokeless tobacco: Never  Vaping Use   Vaping Use: Never used  Substance and Sexual Activity   Alcohol use: Not Currently   Drug use: No   Sexual activity: Not on file  Other Topics Concern   Not on file  Social History Narrative   Not  on file   Social Determinants of Health   Financial Resource Strain: Not on file  Food Insecurity: Not on file  Transportation Needs: Not on file  Physical Activity: Not on file  Stress: Not on file  Social Connections: Not on file  Intimate Partner Violence: Not on file    Review of Systems  Constitutional:  Positive for fatigue.  Respiratory:  Positive for apnea and shortness of breath.   Cardiovascular:  Positive for chest pain.  Psychiatric/Behavioral:  Positive for sleep disturbance.    Vitals:   05/04/21 1124  BP: 122/68  Pulse: (!) 59  Temp: (!) 97.3 F (36.3 C)  SpO2: 96%     Physical Exam Constitutional:      Appearance: He is obese.  HENT:     Nose: No congestion or rhinorrhea.     Mouth/Throat:     Mouth: Mucous membranes are moist.     Comments: Mallampati 4, macroglossia Eyes:     General:         Right eye: No discharge.        Left eye: No discharge.     Pupils: Pupils are equal, round, and reactive to light.  Cardiovascular:     Rate and Rhythm: Normal rate and regular rhythm.     Heart sounds: No murmur heard.   No friction rub.  Pulmonary:     Effort: No respiratory distress.     Breath sounds: No stridor. No wheezing or rhonchi.  Musculoskeletal:     Cervical back: No rigidity or tenderness.  Neurological:     Mental Status: He is alert.  Psychiatric:        Mood and Affect: Mood normal.   Results of the Epworth flowsheet 03/15/2021  Sitting and reading 2  Watching TV 2  Sitting, inactive in a public place (e.g. a theatre or a meeting) 2  As a passenger in a car for an hour without a break 3  Lying down to rest in the afternoon when circumstances permit 0  Sitting and talking to someone 1  Sitting quietly after a lunch without alcohol 3  In a car, while stopped for a few minutes in traffic 0  Total score 13     Data Reviewed: Echocardiogram February 2021 shows severe left ventricular hypertrophy, normal ejection fraction, moderately elevated pulmonary artery systolic pressure Repeat echocardiogram shows normal pulmonary pressures   Referral notes reviewed  Assessment:  Shortness of breath  Obstructive sleep apnea -Confirmed on recent sleep study has severe obstructive sleep apnea  Past history of smoking  Sleep study was discussed with the patient and the need for treatment was discussed  Plan/Recommendations:  Importance of treating his sleep disordered breathing discussed with him  No other evaluation needs done with respect to pulmonary hypertension as pressures are normal  DME referral for initiation of treatment for sleep apnea  I spent 30 minutes dedicated to the care of this patient on the date of this encounter to include previsit review of records, face-to-face time with the patient discussing conditions above, post visit ordering of  testing, clinical documentation with electronic health record and communicated necessary findings to members of the patient's care team  Virl Diamond MD [facility] 05/04/2021, 11:38 AM  CC: Merri Brunette, MD

## 2021-05-17 ENCOUNTER — Other Ambulatory Visit: Payer: Self-pay | Admitting: Cardiovascular Disease

## 2021-05-30 DIAGNOSIS — R7989 Other specified abnormal findings of blood chemistry: Secondary | ICD-10-CM | POA: Diagnosis not present

## 2021-05-30 DIAGNOSIS — E7800 Pure hypercholesterolemia, unspecified: Secondary | ICD-10-CM | POA: Diagnosis not present

## 2021-05-30 DIAGNOSIS — E118 Type 2 diabetes mellitus with unspecified complications: Secondary | ICD-10-CM | POA: Diagnosis not present

## 2021-05-30 DIAGNOSIS — I10 Essential (primary) hypertension: Secondary | ICD-10-CM | POA: Diagnosis not present

## 2021-06-09 ENCOUNTER — Other Ambulatory Visit: Payer: Self-pay | Admitting: Cardiovascular Disease

## 2021-06-19 ENCOUNTER — Other Ambulatory Visit: Payer: Self-pay | Admitting: Cardiovascular Disease

## 2021-06-21 DIAGNOSIS — G4733 Obstructive sleep apnea (adult) (pediatric): Secondary | ICD-10-CM | POA: Diagnosis not present

## 2021-07-05 DIAGNOSIS — G4733 Obstructive sleep apnea (adult) (pediatric): Secondary | ICD-10-CM | POA: Diagnosis not present

## 2021-07-22 ENCOUNTER — Other Ambulatory Visit: Payer: Self-pay | Admitting: Cardiovascular Disease

## 2021-07-22 DIAGNOSIS — G4733 Obstructive sleep apnea (adult) (pediatric): Secondary | ICD-10-CM | POA: Diagnosis not present

## 2021-07-24 DIAGNOSIS — Z7722 Contact with and (suspected) exposure to environmental tobacco smoke (acute) (chronic): Secondary | ICD-10-CM | POA: Diagnosis not present

## 2021-07-24 DIAGNOSIS — E785 Hyperlipidemia, unspecified: Secondary | ICD-10-CM | POA: Diagnosis not present

## 2021-07-24 DIAGNOSIS — E1142 Type 2 diabetes mellitus with diabetic polyneuropathy: Secondary | ICD-10-CM | POA: Diagnosis not present

## 2021-07-24 DIAGNOSIS — G4733 Obstructive sleep apnea (adult) (pediatric): Secondary | ICD-10-CM | POA: Diagnosis not present

## 2021-07-24 DIAGNOSIS — Z6833 Body mass index (BMI) 33.0-33.9, adult: Secondary | ICD-10-CM | POA: Diagnosis not present

## 2021-07-24 DIAGNOSIS — E669 Obesity, unspecified: Secondary | ICD-10-CM | POA: Diagnosis not present

## 2021-07-24 DIAGNOSIS — K219 Gastro-esophageal reflux disease without esophagitis: Secondary | ICD-10-CM | POA: Diagnosis not present

## 2021-07-24 DIAGNOSIS — J309 Allergic rhinitis, unspecified: Secondary | ICD-10-CM | POA: Diagnosis not present

## 2021-07-24 DIAGNOSIS — I10 Essential (primary) hypertension: Secondary | ICD-10-CM | POA: Diagnosis not present

## 2021-07-31 ENCOUNTER — Telehealth: Payer: Self-pay | Admitting: *Deleted

## 2021-07-31 NOTE — Telephone Encounter (Signed)
ATC Brad with [employer] regarding download for patient.  Left VM regarding need for access to [employer] account.  Will await return call.

## 2021-07-31 NOTE — Telephone Encounter (Signed)
ATC patient, as I was leaving a message, a woman answered the phone and said that he knows to bring his SD card.  Nothing further needed.

## 2021-07-31 NOTE — Telephone Encounter (Signed)
Spoke with Brad from [employer] and he states that the patient can't be added to [facility] and will need to bring in SD card. Called and left VM for patient asking him to bring it with him tomorrow to appt. Nothing further needed at this time.

## 2021-08-01 ENCOUNTER — Ambulatory Visit: Payer: Medicare PPO | Admitting: Pulmonary Disease

## 2021-08-01 ENCOUNTER — Encounter: Payer: Self-pay | Admitting: Pulmonary Disease

## 2021-08-01 ENCOUNTER — Other Ambulatory Visit: Payer: Self-pay

## 2021-08-01 VITALS — BP 130/66 | HR 60 | Temp 98.2°F | Ht 70.0 in | Wt 236.8 lb

## 2021-08-01 DIAGNOSIS — G4733 Obstructive sleep apnea (adult) (pediatric): Secondary | ICD-10-CM | POA: Diagnosis not present

## 2021-08-01 DIAGNOSIS — Z9989 Dependence on other enabling machines and devices: Secondary | ICD-10-CM

## 2021-08-01 NOTE — Progress Notes (Signed)
[name] Holt    [id number]    1951/08/15  Primary Care Physician:Pharr, Zollie Beckers, MD  Referring Physician: Merri Brunette, MD [address] [place],  [place] [address]  Chief complaint:   Follow-up for recent visit of shortness of breath and chest discomfort  HPI:  Patient with obstructive sleep apnea -Compliant with CPAP use  Has been feeling relatively well with no significant exacerbation of symptoms  Breathing feels good  Tolerating CPAP well  Usually gets about 6 hours of sleep Wakes up feeling like is at a good nights rest  Shortness of breath is better  Quit smoking over 20 years ago, half a pack a day  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 PATIENT MUST SCHEDULE APPOINTMENT FOR FUTURE REFILLS. 1ST ATTEMPT   aspirin 81 MG EC tablet Take 81 mg by mouth daily.   bisoprolol (ZEBETA) 5 MG tablet    cetirizine (ZYRTEC) 10 MG tablet Take 10 mg by mouth daily.   famotidine (PEPCID) 20 MG tablet Take 20 mg by mouth at bedtime.   olmesartan (BENICAR) 20 MG tablet Take 20 mg by mouth daily.   pantoprazole (PROTONIX) 40 MG tablet    rosuvastatin (CRESTOR) 20 MG tablet Take 1 tablet (20 mg total) by mouth daily. (Patient taking differently: Take 10 mg by mouth daily.)   XIIDRA 5 % SOLN    No facility-administered encounter medications on file as of 05/04/2021.    Allergies as of 05/04/2021   (No Known Allergies)    Past Medical History:  Diagnosis Date   Allergic rhinitis    Chest pain    NON CARDIAC   Hyperlipidemia     Hypertension    Sleep apnea     Past Surgical History:  Procedure Laterality Date   CYSTECTOMY     LEFT WRIST    Family History  Problem Relation Age of Onset   Hypertension Mother    Stroke Mother    Diabetes Mother    Heart disease Mother    Stroke Father    Heart attack Father    Pneumonia Brother    Heart attack Brother     Social History   Socioeconomic History   Marital status: Married    Spouse name: Not on file   Number of children: Not on file   Years of education: Not on file   Highest education level: Not on file  Occupational History   Not on file  Tobacco Use   Smoking status: Former    Years: 15.00    Pack years: 0.00    Types: Cigarettes    Quit date: 2000    Years since quitting: 22.5   Smokeless tobacco: Never  Vaping Use   Vaping Use: Never used  Substance and Sexual Activity   Alcohol use: Not Currently   Drug use: No   Sexual activity: Not on file  Other Topics Concern   Not on file  Social History Narrative   Not on file   Social Determinants of Health   Financial Resource Strain: Not  on file  Food Insecurity: Not on file  Transportation Needs: Not on file  Physical Activity: Not on file  Stress: Not on file  Social Connections: Not on file  Intimate Partner Violence: Not on file    Review of Systems  Constitutional:  Positive for fatigue.  Respiratory:  Positive for apnea and shortness of breath.   Cardiovascular:  Positive for chest pain.  Psychiatric/Behavioral:  Positive for sleep disturbance.    Vitals:   05/04/21 1124  BP: 122/68  Pulse: (!) 59  Temp: (!) 97.3 F (36.3 C)  SpO2: 96%     Physical Exam Constitutional:      Appearance: He is obese.  HENT:     Nose: No congestion or rhinorrhea.     Mouth/Throat:     Mouth: Mucous membranes are moist.     Comments: Mallampati 4, macroglossia Eyes:     General:        Right eye: No discharge.        Left eye: No discharge.     Pupils: Pupils are equal,  round, and reactive to light.  Cardiovascular:     Rate and Rhythm: Normal rate and regular rhythm.     Heart sounds: No murmur heard.   No friction rub.  Pulmonary:     Effort: No respiratory distress.     Breath sounds: No stridor. No wheezing or rhonchi.  Musculoskeletal:     Cervical back: No rigidity or tenderness.  Neurological:     Mental Status: He is alert.  Psychiatric:        Mood and Affect: Mood normal.   Results of the Epworth flowsheet 03/15/2021  Sitting and reading 2  Watching TV 2  Sitting, inactive in a public place (e.g. a theatre or a meeting) 2  As a passenger in a car for an hour without a break 3  Lying down to rest in the afternoon when circumstances permit 0  Sitting and talking to someone 1  Sitting quietly after a lunch without alcohol 3  In a car, while stopped for a few minutes in traffic 0  Total score 13     Data Reviewed: Echocardiogram February 2021 shows severe left ventricular hypertrophy, normal ejection fraction, moderately elevated pulmonary artery systolic pressure Repeat echocardiogram shows normal pulmonary pressures   Compliance data reveals 100% compliant with CPAP Set between 5 and 20, 95 percentile pressure of 12.7 AHI of 4.6  Assessment:  Shortness of breath  Obstructive sleep apnea -Tolerating CPAP well -Benefiting from CPAP use   Plan/Recommendations:  Continue CPAP on a nightly basis  Graded exercise as tolerated  Encouraged to call with any significant concerns  Follow-up in 6 months  Virl Diamond MD [facility] 05/04/2021, 11:38 AM  CC: Merri Brunette, MD

## 2021-08-01 NOTE — Patient Instructions (Signed)
Severe obstructive sleep apnea adequately treated with CPAP therapy  Excellent compliance with optimal treatment  Continue using CPAP nightly  Call us with any significant concerns  Follow-up in 6 months

## 2021-08-21 DIAGNOSIS — G4733 Obstructive sleep apnea (adult) (pediatric): Secondary | ICD-10-CM | POA: Diagnosis not present

## 2021-08-23 DIAGNOSIS — R7989 Other specified abnormal findings of blood chemistry: Secondary | ICD-10-CM | POA: Diagnosis not present

## 2021-08-23 DIAGNOSIS — E118 Type 2 diabetes mellitus with unspecified complications: Secondary | ICD-10-CM | POA: Diagnosis not present

## 2021-08-23 DIAGNOSIS — E7800 Pure hypercholesterolemia, unspecified: Secondary | ICD-10-CM | POA: Diagnosis not present

## 2021-08-30 DIAGNOSIS — R7989 Other specified abnormal findings of blood chemistry: Secondary | ICD-10-CM | POA: Diagnosis not present

## 2021-08-30 DIAGNOSIS — I10 Essential (primary) hypertension: Secondary | ICD-10-CM | POA: Diagnosis not present

## 2021-08-30 DIAGNOSIS — E118 Type 2 diabetes mellitus with unspecified complications: Secondary | ICD-10-CM | POA: Diagnosis not present

## 2021-08-30 DIAGNOSIS — E7800 Pure hypercholesterolemia, unspecified: Secondary | ICD-10-CM | POA: Diagnosis not present

## 2021-12-25 DIAGNOSIS — R7989 Other specified abnormal findings of blood chemistry: Secondary | ICD-10-CM | POA: Diagnosis not present

## 2021-12-25 DIAGNOSIS — I10 Essential (primary) hypertension: Secondary | ICD-10-CM | POA: Diagnosis not present

## 2021-12-25 DIAGNOSIS — E7800 Pure hypercholesterolemia, unspecified: Secondary | ICD-10-CM | POA: Diagnosis not present

## 2021-12-25 DIAGNOSIS — E118 Type 2 diabetes mellitus with unspecified complications: Secondary | ICD-10-CM | POA: Diagnosis not present

## 2022-01-01 DIAGNOSIS — E7800 Pure hypercholesterolemia, unspecified: Secondary | ICD-10-CM | POA: Diagnosis not present

## 2022-01-01 DIAGNOSIS — R7989 Other specified abnormal findings of blood chemistry: Secondary | ICD-10-CM | POA: Diagnosis not present

## 2022-01-01 DIAGNOSIS — E118 Type 2 diabetes mellitus with unspecified complications: Secondary | ICD-10-CM | POA: Diagnosis not present

## 2022-01-01 DIAGNOSIS — I10 Essential (primary) hypertension: Secondary | ICD-10-CM | POA: Diagnosis not present

## 2022-02-12 DIAGNOSIS — I10 Essential (primary) hypertension: Secondary | ICD-10-CM | POA: Diagnosis not present

## 2022-02-12 DIAGNOSIS — E118 Type 2 diabetes mellitus with unspecified complications: Secondary | ICD-10-CM | POA: Diagnosis not present

## 2022-02-12 DIAGNOSIS — E7800 Pure hypercholesterolemia, unspecified: Secondary | ICD-10-CM | POA: Diagnosis not present

## 2022-02-13 ENCOUNTER — Ambulatory Visit: Payer: Medicare PPO | Admitting: Pulmonary Disease

## 2022-02-13 ENCOUNTER — Encounter: Payer: Self-pay | Admitting: Pulmonary Disease

## 2022-02-13 VITALS — BP 136/70 | HR 64 | Temp 98.3°F | Ht 70.0 in | Wt 232.0 lb

## 2022-02-13 DIAGNOSIS — G4733 Obstructive sleep apnea (adult) (pediatric): Secondary | ICD-10-CM

## 2022-02-13 DIAGNOSIS — Z9989 Dependence on other enabling machines and devices: Secondary | ICD-10-CM

## 2022-02-13 NOTE — Patient Instructions (Signed)
Continue CPAP on a nightly basis ? ?I will see you a year from now ? ?Call with significant concerns ? ?Weight loss efforts as able ?

## 2022-02-13 NOTE — Progress Notes (Signed)
? ?      [name] Holt    [id number]    Mar 20, 1951 ? ?Primary Care Physician:Pharr, Zollie Beckers, MD ? ?Referring Physician: Merri Brunette, MD ?[address] ?[place],  [place] [address] ? ?Chief complaint:   ?Follow-up for obstructive sleep apnea ? ?HPI: ? ?Patient with obstructive sleep apnea ?-Continues to be very compliant with CPAP use ?-Occasional mask issues with his beard ? ?Has been feeling relatively well with no significant exacerbation of symptoms ? ?Breathing continues to be stable ? ?Tolerating CPAP well ? ?Usually gets about 6 hours of sleep ?Wakes up feeling restored ? ?Quit smoking over 20 years ago, half a pack a day ? 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 ?Reformed smoker ? ?He has hypertension, hypercholesterolemia, allergies ? ?It was his allergies and nasal stuffiness and congestion that made him discontinue CPAP ? ?Outpatient Encounter Medications as of 02/13/2022  ?Medication Sig  ? amLODipine (NORVASC) 10 MG tablet TAKE 1 TABLET BY MOUTH EVERY DAY **MUST SCHEDULE APPT FOR FUTURE REFILLS**  ? aspirin 81 MG EC tablet Take 81 mg by mouth daily.  ? bisoprolol (ZEBETA) 5 MG tablet   ? cetirizine (ZYRTEC) 10 MG tablet Take 10 mg by mouth daily.  ? famotidine (PEPCID) 20 MG tablet Take 20 mg by mouth at bedtime.  ? metFORMIN (GLUCOPHAGE-XR) 500 MG 24 hr tablet Take 500 mg by mouth in the morning, at noon, in the evening, and at bedtime.  ? olmesartan (BENICAR) 20 MG tablet Take 20 mg by mouth daily.  ? pantoprazole (PROTONIX) 40 MG tablet   ? rosuvastatin (CRESTOR) 20 MG tablet Take 1 tablet (20 mg total) by mouth daily. (Patient taking differently: Take 10 mg by mouth daily.)  ? [DISCONTINUED] XIIDRA 5 % SOLN   ? ?No facility-administered encounter medications on file as of 02/13/2022.  ? ? ?Allergies as of 02/13/2022  ? (No Known Allergies)  ? ? ?Past Medical History:  ?Diagnosis Date  ? Allergic  rhinitis   ? Chest pain   ? NON CARDIAC  ? Hyperlipidemia   ? Hypertension   ? Sleep apnea   ? ? ?Past Surgical History:  ?Procedure Laterality Date  ? CYSTECTOMY    ? LEFT WRIST  ? ? ?Family History  ?Problem Relation Age of Onset  ? Hypertension Mother   ? Stroke Mother   ? Diabetes Mother   ? Heart disease Mother   ? Stroke Father   ? Heart attack Father   ? Pneumonia Brother   ? Heart attack Brother   ? ? ?Social History  ? ?Socioeconomic History  ? Marital status: Married  ?  Spouse name: Not on file  ? Number of children: Not on file  ? Years of education: Not on file  ? Highest education level: Not on file  ?Occupational History  ? Not on file  ?Tobacco Use  ? Smoking status: Former  ?  Years: 15.00  ?  Types: Cigarettes  ?  Quit date: 2000  ?  Years since quitting: 23.2  ? Smokeless tobacco: Never  ?Vaping Use  ? Vaping Use: Never used  ?Substance and Sexual Activity  ? Alcohol use: Not Currently  ? Drug use: No  ? Sexual activity: Not on file  ?Other Topics Concern  ? Not on file  ?Social History Narrative  ? Not on file  ? ?Social Determinants of Health  ? ?  Financial Resource Strain: Not on file  ?Food Insecurity: Not on file  ?Transportation Needs: Not on file  ?Physical Activity: Not on file  ?Stress: Not on file  ?Social Connections: Not on file  ?Intimate Partner Violence: Not on file  ? ? ?Review of Systems  ?Respiratory:  Positive for apnea.   ?Psychiatric/Behavioral:  Positive for sleep disturbance.   ? ?Vitals:  ? 02/13/22 1509  ?BP: 136/70  ?Pulse: 64  ?Temp: 98.3 ?F (36.8 ?C)  ?SpO2: 99%  ? ? ? ?Physical Exam ?Constitutional:   ?   Appearance: He is obese.  ?HENT:  ?   Nose: No congestion or rhinorrhea.  ?   Mouth/Throat:  ?   Comments: Mallampati 4, macroglossia ?Cardiovascular:  ?   Rate and Rhythm: Normal rate and regular rhythm.  ?   Heart sounds: No murmur heard. ?  No friction rub.  ?Pulmonary:  ?   Effort: No respiratory distress.  ?   Breath sounds: No stridor. No wheezing or rhonchi.   ?Musculoskeletal:     ?   General: Normal range of motion.  ?   Cervical back: No rigidity or tenderness.  ?Neurological:  ?   Mental Status: He is alert.  ?Psychiatric:     ?   Mood and Affect: Mood normal.  ? ? ?  03/15/2021  ?  1:00 PM  ?Results of the Epworth flowsheet  ?Sitting and reading 2  ?Watching TV 2  ?Sitting, inactive in a public place (e.g. a theatre or a meeting) 2  ?As a passenger in a car for an hour without a break 3  ?Lying down to rest in the afternoon when circumstances permit 0  ?Sitting and talking to someone 1  ?Sitting quietly after a lunch without alcohol 3  ?In a car, while stopped for a few minutes in traffic 0  ?Total score 13  ? ? ? ?Data Reviewed: ?Echocardiogram February 2021 shows severe left ventricular hypertrophy, normal ejection fraction, moderately elevated pulmonary artery systolic pressure ?Repeat echocardiogram shows normal pulmonary pressures ? ?Compliance data reveals 100% compliance with CPAP use ?Average use of 6 hours ?Machine is set between 5 and 20 ?95 percentile pressure of 12.5 ?Residual AHI of 2.5 ? ?Assessment: ? ?Obstructive sleep apnea ?-Tolerating CPAP well ?-Continues to benefit from CPAP use ? ?Improved daytime symptoms ? ? ?Plan/Recommendations: ? ?Continue CPAP on a nightly basis ? ?Graded exercise as tolerated ? ?Encouraged to call with any significant concerns ? ?Follow-up a year from now ? ? ? ?Virl Diamond MD ?[facility] ?02/13/2022, 3:37 PM ? ?CC: Merri Brunette, MD ? ? ?

## 2022-03-26 DIAGNOSIS — E118 Type 2 diabetes mellitus with unspecified complications: Secondary | ICD-10-CM | POA: Diagnosis not present

## 2022-03-26 DIAGNOSIS — E7800 Pure hypercholesterolemia, unspecified: Secondary | ICD-10-CM | POA: Diagnosis not present

## 2022-03-26 DIAGNOSIS — I10 Essential (primary) hypertension: Secondary | ICD-10-CM | POA: Diagnosis not present

## 2022-05-03 DIAGNOSIS — E118 Type 2 diabetes mellitus with unspecified complications: Secondary | ICD-10-CM | POA: Diagnosis not present

## 2022-05-03 DIAGNOSIS — E7800 Pure hypercholesterolemia, unspecified: Secondary | ICD-10-CM | POA: Diagnosis not present

## 2022-05-03 DIAGNOSIS — I10 Essential (primary) hypertension: Secondary | ICD-10-CM | POA: Diagnosis not present

## 2022-05-03 DIAGNOSIS — Z125 Encounter for screening for malignant neoplasm of prostate: Secondary | ICD-10-CM | POA: Diagnosis not present

## 2022-05-08 ENCOUNTER — Other Ambulatory Visit: Payer: Self-pay | Admitting: Internal Medicine

## 2022-05-08 DIAGNOSIS — E876 Hypokalemia: Secondary | ICD-10-CM | POA: Diagnosis not present

## 2022-05-08 DIAGNOSIS — E7800 Pure hypercholesterolemia, unspecified: Secondary | ICD-10-CM

## 2022-05-08 DIAGNOSIS — I10 Essential (primary) hypertension: Secondary | ICD-10-CM | POA: Diagnosis not present

## 2022-05-08 DIAGNOSIS — E118 Type 2 diabetes mellitus with unspecified complications: Secondary | ICD-10-CM | POA: Diagnosis not present

## 2022-05-08 DIAGNOSIS — K219 Gastro-esophageal reflux disease without esophagitis: Secondary | ICD-10-CM | POA: Diagnosis not present

## 2022-05-08 DIAGNOSIS — I444 Left anterior fascicular block: Secondary | ICD-10-CM | POA: Diagnosis not present

## 2022-05-08 DIAGNOSIS — Z6833 Body mass index (BMI) 33.0-33.9, adult: Secondary | ICD-10-CM | POA: Diagnosis not present

## 2022-05-08 DIAGNOSIS — Z0000 Encounter for general adult medical examination without abnormal findings: Secondary | ICD-10-CM | POA: Diagnosis not present

## 2022-05-08 DIAGNOSIS — I119 Hypertensive heart disease without heart failure: Secondary | ICD-10-CM | POA: Diagnosis not present

## 2022-05-10 ENCOUNTER — Other Ambulatory Visit (HOSPITAL_COMMUNITY): Payer: Self-pay | Admitting: Internal Medicine

## 2022-05-10 DIAGNOSIS — I119 Hypertensive heart disease without heart failure: Secondary | ICD-10-CM

## 2022-05-23 ENCOUNTER — Ambulatory Visit (HOSPITAL_COMMUNITY)
Admission: RE | Admit: 2022-05-23 | Discharge: 2022-05-23 | Disposition: A | Discharge status: Home / Self Care | Payer: Medicare PPO | Source: Ambulatory Visit | Attending: Internal Medicine | Admitting: Internal Medicine

## 2022-05-23 DIAGNOSIS — I350 Nonrheumatic aortic (valve) stenosis: Secondary | ICD-10-CM | POA: Diagnosis not present

## 2022-05-23 DIAGNOSIS — I119 Hypertensive heart disease without heart failure: Secondary | ICD-10-CM | POA: Diagnosis not present

## 2022-05-23 DIAGNOSIS — I358 Other nonrheumatic aortic valve disorders: Secondary | ICD-10-CM | POA: Insufficient documentation

## 2022-05-23 DIAGNOSIS — I351 Nonrheumatic aortic (valve) insufficiency: Secondary | ICD-10-CM | POA: Insufficient documentation

## 2022-05-23 DIAGNOSIS — E785 Hyperlipidemia, unspecified: Secondary | ICD-10-CM | POA: Diagnosis not present

## 2022-05-23 LAB — ECHOCARDIOGRAM COMPLETE
AR max vel: 1.97 cm2
AV Area VTI: 2.19 cm2
AV Area mean vel: 1.98 cm2
AV Mean grad: 11 mmHg
AV Peak grad: 20.3 mmHg
Ao pk vel: 2.25 m/s
Area-P 1/2: 4.39 cm2
P 1/2 time: 456 msec
S' Lateral: 2.7 cm

## 2022-05-23 NOTE — Progress Notes (Signed)
  Echocardiogram 2D Echocardiogram has been performed.  Delcie Roch 05/23/2022, 8:26 AM

## 2022-05-24 ENCOUNTER — Encounter: Payer: Self-pay | Admitting: Cardiovascular Disease

## 2022-05-24 ENCOUNTER — Ambulatory Visit: Payer: Medicare PPO | Admitting: Cardiovascular Disease

## 2022-05-24 VITALS — BP 138/64 | HR 61 | Ht 70.0 in | Wt 229.4 lb

## 2022-05-24 DIAGNOSIS — I350 Nonrheumatic aortic (valve) stenosis: Secondary | ICD-10-CM | POA: Diagnosis not present

## 2022-05-24 DIAGNOSIS — I10 Essential (primary) hypertension: Secondary | ICD-10-CM | POA: Diagnosis not present

## 2022-05-24 DIAGNOSIS — K219 Gastro-esophageal reflux disease without esophagitis: Secondary | ICD-10-CM

## 2022-05-24 NOTE — Patient Instructions (Signed)
Medication Instructions:  The current medical regimen is effective;  continue present plan and medications.  *If you need a refill on your cardiac medications before your next appointment, please call your pharmacy*   Testing/Procedures: Echocardiogram (12 months) - Your physician has requested that you have an echocardiogram. Echocardiography is a painless test that uses sound waves to create images of your heart. It provides your doctor with information about the size and shape of your heart and how well your heart's chambers and valves are working. This procedure takes approximately one hour. There are no restrictions for this procedure.     Follow-Up: At [facility], you and your health needs are our priority.  As part of our continuing mission to provide you with exceptional heart care, we have created designated Provider Care Teams.  These Care Teams include your primary Cardiologist (physician) and Advanced Practice Providers (APPs -  Physician Assistants and Nurse Practitioners) who all work together to provide you with the care you need, when you need it.  We recommend signing up for the patient portal called "MyChart".  Sign up information is provided on this After Visit Summary.  MyChart is used to connect with patients for Virtual Visits (Telemedicine).  Patients are able to view lab/test results, encounter notes, upcoming appointments, etc.  Non-urgent messages can be sent to your provider as well.   To learn more about what you can do with MyChart, go to [email or web address].    Your next appointment:   12 month(s)  The format for your next appointment:   In Person  Provider:   [place] T O'Neal, MD           

## 2022-05-24 NOTE — Progress Notes (Signed)
Cardiology Office Note:   Date:  05/24/2022  NAME:  [name] Holt    MRN: [id number] DOB:  Dec 12, 1950   PCP:  Merri Brunette, MD  Cardiologist:  Reatha Harps, MD  Electrophysiologist:  None   Referring MD: Merri Brunette, MD   Chief Complaint  Patient presents with   Follow-up        History of Present Illness:   [name] Holt is a 71 y.o. male with a hx of hypertension hyperlipidemia, OSA who presents for follow-up.  He reports he is doing well.  Can get short of breath with doing yard work or cutting grass.  Most of this is expected.  His work-up was negative in 2021.  Underwent a stress test that was normal.  Underwent an echo that was really unremarkable.  He did undergo an echo yesterday as ordered by his primary care physician.  Has mild aortic stenosis.  This can be followed.  He is now on diabetes medications.  Still on cholesterol medicine.  Blood pressure 138/64.  Reports occasional chest tightness.  Describes sharp pain in his chest that occurs randomly.  Can occur 2 times per week.  Can last seconds to minutes.  Resolved without intervention.  Does not appear to be exertional.  Suspect this is acid reflux.  His testing in 2021 was normal.  No other updates to medical history.  Overall doing well.  CV exam is normal.  Problem List 1. HTN 2. HLD -total cholesterol 133, triglycerides 56, HDL 62, LDL 59 3. CP/SOB -normal MPI, echo with normal EF and mild basal septal hypertrophy on my read  -BNP 2.8 4. OSA 5.  Aortic stenosis, mild  Past Medical History: Past Medical History:  Diagnosis Date   Allergic rhinitis    Chest pain    NON CARDIAC   Hyperlipidemia    Hypertension    Sleep apnea     Past Surgical History: Past Surgical History:  Procedure Laterality Date   CYSTECTOMY     LEFT WRIST    Current Medications: Current Meds  Medication Sig   amLODipine (NORVASC) 10 MG tablet TAKE 1 TABLET BY MOUTH EVERY DAY **MUST SCHEDULE APPT FOR FUTURE REFILLS**    aspirin 81 MG EC tablet Take 81 mg by mouth daily.   bisoprolol (ZEBETA) 5 MG tablet    cetirizine (ZYRTEC) 10 MG tablet Take 10 mg by mouth daily.   famotidine (PEPCID) 20 MG tablet Take 20 mg by mouth at bedtime.   metFORMIN (GLUCOPHAGE-XR) 500 MG 24 hr tablet Take 500 mg by mouth in the morning, at noon, in the evening, and at bedtime.   olmesartan (BENICAR) 20 MG tablet Take 20 mg by mouth daily.   pantoprazole (PROTONIX) 40 MG tablet    rosuvastatin (CRESTOR) 20 MG tablet Take 1 tablet (20 mg total) by mouth daily. (Patient taking differently: Take 10 mg by mouth daily.)     Allergies:    Patient has no known allergies.   Social History: Social History   Socioeconomic History   Marital status: Married    Spouse name: Not on file   Number of children: Not on file   Years of education: Not on file   Highest education level: Not on file  Occupational History   Not on file  Tobacco Use   Smoking status: Former    Years: 15.00    Types: Cigarettes    Quit date: 2000    Years since quitting: 23.5   Smokeless tobacco: Never  Vaping Use   Vaping Use: Never used  Substance and Sexual Activity   Alcohol use: Not Currently   Drug use: No   Sexual activity: Not on file  Other Topics Concern   Not on file  Social History Narrative   Not on file   Social Determinants of Health   Financial Resource Strain: Not on file  Food Insecurity: Not on file  Transportation Needs: Not on file  Physical Activity: Not on file  Stress: Not on file  Social Connections: Not on file     Family History: The patient's family history includes Diabetes in his mother; Heart attack in his brother and father; Heart disease in his mother; Hypertension in his mother; Pneumonia in his brother; Stroke in his father and mother.  ROS:   All other ROS reviewed and negative. Pertinent positives noted in the HPI.     EKGs/Labs/Other Studies Reviewed:   The following studies were personally  reviewed by me today:   TTE 05/23/2022  1. Left ventricular ejection fraction, by estimation, is 60 to 65%. The  left ventricle has normal function. The left ventricle has no regional  wall motion abnormalities. There is mild concentric left ventricular  hypertrophy. Left ventricular diastolic  parameters are consistent with Grade I diastolic dysfunction (impaired  relaxation).   2. Right ventricular systolic function is normal. The right ventricular  size is normal. There is normal pulmonary artery systolic pressure.   3. The mitral valve is normal in structure. No evidence of mitral valve  regurgitation. No evidence of mitral stenosis.   4. The aortic valve is tricuspid. There is mild calcification of the  aortic valve. There is mild thickening of the aortic valve. Aortic valve  regurgitation is mild to moderate. Mild aortic valve stenosis. Aortic  regurgitation PHT measures 456 msec.  Aortic valve area, by VTI measures 2.19 cm. Aortic valve mean gradient  measures 11.0 mmHg. Aortic valve Vmax measures 2.25 m/s.   5. There is mild dilatation of the ascending aorta, measuring 39 mm.   6. The inferior vena cava is normal in size with greater than 50%  respiratory variability, suggesting right atrial pressure of 3 mmHg.   Recent Labs: No results found for requested labs within last 365 days.   Recent Lipid Panel    Component Value Date/Time   CHOL (H) 05/31/2009 0335    214        ATP III CLASSIFICATION:  <200     mg/dL   Desirable  585-277  mg/dL   Borderline High  >=824    mg/dL   High          TRIG 235 05/31/2009 0335   HDL 48 05/31/2009 0335   CHOLHDL 4.5 05/31/2009 0335   VLDL 25 05/31/2009 0335   LDLCALC (H) 05/31/2009 0335    141        Total Cholesterol/HDL:CHD Risk Coronary Heart Disease Risk Table                     Men   Women  1/2 Average Risk   3.4   3.3  Average Risk       5.0   4.4  2 X Average Risk   9.6   7.1  3 X Average Risk  23.4   11.0         Use the calculated Patient Ratio above and the CHD Risk Table to determine the patient's CHD Risk.  ATP III CLASSIFICATION (LDL):  <100     mg/dL   Optimal  314-970  mg/dL   Near or Above                    Optimal  130-159  mg/dL   Borderline  263-785  mg/dL   High  >885     mg/dL   Very High    Physical Exam:   VS:  BP 138/64   Pulse 61   Ht 5\' 10"  (1.778 m)   Wt 229 lb 6.4 oz (104.1 kg)   SpO2 96%   BMI 32.92 kg/m    Wt Readings from Last 3 Encounters:  05/24/22 229 lb 6.4 oz (104.1 kg)  02/13/22 232 lb (105.2 kg)  08/01/21 236 lb 12.8 oz (107.4 kg)    General: Well nourished, well developed, in no acute distress Head: Atraumatic, normal size  Eyes: PEERLA, EOMI  Neck: Supple, no JVD Endocrine: No thryomegaly Cardiac: Normal S1, S2; RRR; 2 out of 6 systolic ejection murmur Lungs: Clear to auscultation bilaterally, no wheezing, rhonchi or rales  Abd: Soft, nontender, no hepatomegaly  Ext: No edema, pulses 2+ Musculoskeletal: No deformities, BUE and BLE strength normal and equal Skin: Warm and dry, no rashes   Neuro: Alert and oriented to person, place, time, and situation, CNII-XII grossly intact, no focal deficits  Psych: Normal mood and affect   ASSESSMENT:   [name] Holt is a 71 y.o. male who presents for the following: 1. Nonrheumatic aortic valve stenosis   2. Primary hypertension   3. Gastroesophageal reflux disease without esophagitis     PLAN:   1. Nonrheumatic aortic valve stenosis -Mild aortic stenosis.  We will repeat his echocardiogram next year to see if there is any change.  Symptoms of shortness of breath are likely related to deconditioning.  Stress test is normal.  Echo really unremarkable.  2. Primary hypertension -Well-controlled.  We will continue current regimen.  Includes amlodipine 10 mg daily, bisoprolol 5 mg daily, olmesartan 20 mg daily.  Further titration as needed.  3. Gastroesophageal reflux disease without  esophagitis -Describes likely noncardiac chest discomfort.  Suspect this is acid reflux.  Stress test in 2021 was normal.  Echo largely normal.       Disposition: Return in about 1 year (around 05/25/2023).  Medication Adjustments/Labs and Tests Ordered: Current medicines are reviewed at length with the patient today.  Concerns regarding medicines are outlined above.  Orders Placed This Encounter  Procedures   ECHOCARDIOGRAM COMPLETE   No orders of the defined types were placed in this encounter.   Patient Instructions  Medication Instructions:  The current medical regimen is effective;  continue present plan and medications.  *If you need a refill on your cardiac medications before your next appointment, please call your pharmacy*   Testing/Procedures: Echocardiogram (12 months) - Your physician has requested that you have an echocardiogram. Echocardiography is a painless test that uses sound waves to create images of your heart. It provides your doctor with information about the size and shape of your heart and how well your heart's chambers and valves are working. This procedure takes approximately one hour. There are no restrictions for this procedure.     Follow-Up: At [facility], you and your health needs are our priority.  As part of our continuing mission to provide you with exceptional heart care, we have created designated Provider Care Teams.  These Care Teams include your primary Cardiologist (physician) and  Advanced Practice Providers (APPs -  Physician Assistants and Nurse Practitioners) who all work together to provide you with the care you need, when you need it.  We recommend signing up for the patient portal called "MyChart".  Sign up information is provided on this After Visit Summary.  MyChart is used to connect with patients for Virtual Visits (Telemedicine).  Patients are able to view lab/test results, encounter notes, upcoming appointments, etc.  Non-urgent  messages can be sent to your provider as well.   To learn more about what you can do with MyChart, go to [email or web address].    Your next appointment:   12 month(s)  The format for your next appointment:   In Person  Provider:   Reatha Harps, MD             Time Spent with Patient: I have spent a total of 25 minutes with patient reviewing hospital notes, telemetry, EKGs, labs and examining the patient as well as establishing an assessment and plan that was discussed with the patient.  > 50% of time was spent in direct patient care.  Signed, Lenna Gilford. Flora Lipps, MD, [facility]  [facility]  [address] [place], [place] [address] [phone]  05/24/2022 2:59 PM

## 2022-06-13 ENCOUNTER — Ambulatory Visit: Payer: Medicare PPO | Admitting: Student

## 2022-07-01 DIAGNOSIS — Z6833 Body mass index (BMI) 33.0-33.9, adult: Secondary | ICD-10-CM | POA: Diagnosis not present

## 2022-07-01 DIAGNOSIS — E785 Hyperlipidemia, unspecified: Secondary | ICD-10-CM | POA: Diagnosis not present

## 2022-07-01 DIAGNOSIS — E669 Obesity, unspecified: Secondary | ICD-10-CM | POA: Diagnosis not present

## 2022-07-01 DIAGNOSIS — I10 Essential (primary) hypertension: Secondary | ICD-10-CM | POA: Diagnosis not present

## 2022-07-01 DIAGNOSIS — E1142 Type 2 diabetes mellitus with diabetic polyneuropathy: Secondary | ICD-10-CM | POA: Diagnosis not present

## 2022-07-01 DIAGNOSIS — G4733 Obstructive sleep apnea (adult) (pediatric): Secondary | ICD-10-CM | POA: Diagnosis not present

## 2022-07-01 DIAGNOSIS — Z7982 Long term (current) use of aspirin: Secondary | ICD-10-CM | POA: Diagnosis not present

## 2022-07-01 DIAGNOSIS — Z7984 Long term (current) use of oral hypoglycemic drugs: Secondary | ICD-10-CM | POA: Diagnosis not present

## 2022-07-01 DIAGNOSIS — K219 Gastro-esophageal reflux disease without esophagitis: Secondary | ICD-10-CM | POA: Diagnosis not present

## 2022-07-11 ENCOUNTER — Ambulatory Visit
Admission: RE | Admit: 2022-07-11 | Discharge: 2022-07-11 | Disposition: A | Discharge status: Home / Self Care | Payer: No Typology Code available for payment source | Source: Ambulatory Visit | Attending: Internal Medicine | Admitting: Internal Medicine

## 2022-07-11 DIAGNOSIS — I700 Atherosclerosis of aorta: Secondary | ICD-10-CM | POA: Diagnosis not present

## 2022-07-11 DIAGNOSIS — E7800 Pure hypercholesterolemia, unspecified: Secondary | ICD-10-CM

## 2022-08-01 DIAGNOSIS — R202 Paresthesia of skin: Secondary | ICD-10-CM | POA: Diagnosis not present

## 2022-08-01 DIAGNOSIS — R918 Other nonspecific abnormal finding of lung field: Secondary | ICD-10-CM | POA: Diagnosis not present

## 2022-08-01 DIAGNOSIS — I2510 Atherosclerotic heart disease of native coronary artery without angina pectoris: Secondary | ICD-10-CM | POA: Diagnosis not present

## 2022-08-01 DIAGNOSIS — R931 Abnormal findings on diagnostic imaging of heart and coronary circulation: Secondary | ICD-10-CM | POA: Diagnosis not present

## 2022-08-15 DIAGNOSIS — I10 Essential (primary) hypertension: Secondary | ICD-10-CM | POA: Diagnosis not present

## 2022-08-15 DIAGNOSIS — E1169 Type 2 diabetes mellitus with other specified complication: Secondary | ICD-10-CM | POA: Diagnosis not present

## 2022-08-15 DIAGNOSIS — E7800 Pure hypercholesterolemia, unspecified: Secondary | ICD-10-CM | POA: Diagnosis not present

## 2022-08-15 DIAGNOSIS — R7989 Other specified abnormal findings of blood chemistry: Secondary | ICD-10-CM | POA: Diagnosis not present

## 2022-08-15 DIAGNOSIS — R931 Abnormal findings on diagnostic imaging of heart and coronary circulation: Secondary | ICD-10-CM | POA: Diagnosis not present

## 2022-08-15 DIAGNOSIS — E118 Type 2 diabetes mellitus with unspecified complications: Secondary | ICD-10-CM | POA: Diagnosis not present

## 2022-09-04 ENCOUNTER — Encounter: Payer: Self-pay | Admitting: *Deleted

## 2022-09-10 ENCOUNTER — Ambulatory Visit: Payer: Medicare PPO | Admitting: Diagnostic Neuroimaging

## 2022-09-10 ENCOUNTER — Encounter: Payer: Self-pay | Admitting: Diagnostic Neuroimaging

## 2022-09-10 VITALS — BP 143/70 | HR 64 | Ht 70.0 in | Wt 226.0 lb

## 2022-09-10 DIAGNOSIS — R200 Anesthesia of skin: Secondary | ICD-10-CM | POA: Diagnosis not present

## 2022-09-10 DIAGNOSIS — R202 Paresthesia of skin: Secondary | ICD-10-CM

## 2022-09-10 NOTE — Patient Instructions (Signed)
Transient left face, neck, shoulder numbness (ddx: TIA, partial seizure or migraine phenomenon) - check MRI brain, carotid u/s, EEG - continue aspirin, statin, BP and DM control

## 2022-09-10 NOTE — Progress Notes (Signed)
[facility]  PATIENT: [name] Holt DOB: [birth year]  REFERRING CLINICIAN: Deland Pretty, MD HISTORY FROM: patient REASON FOR VISIT: new consult   HISTORICAL  CHIEF COMPLAINT:  Chief Complaint  Patient presents with   New Patient (Initial Visit)    Pt alone, rm 7. Pt has been having numbness on L side of face down in to the shoulder. This intermittently comes and goes over the couple months. Initially was painful when first started up but has eased up. Pt has not had any imaging completed on neck or head.     HISTORY OF PRESENT ILLNESS:   71 year old male with intermittent left face and shoulder numbness.  Symptoms started 2 months ago.  Happening on a daily basis up to 5 times per day.  Symptoms last up to 1 minute at a time.  No weakness or pain.  No problems in right side of body.  No problems in hands or feet.   REVIEW OF SYSTEMS: Full 14 system review of systems performed and negative with exception of: as per HPI  ALLERGIES: No Known Allergies  HOME MEDICATIONS: Outpatient Medications Prior to Visit  Medication Sig Dispense Refill   amLODipine (NORVASC) 10 MG tablet TAKE 1 TABLET BY MOUTH EVERY DAY **MUST SCHEDULE APPT FOR FUTURE REFILLS** 15 tablet 0   aspirin 81 MG EC tablet Take 81 mg by mouth daily.     bisoprolol (ZEBETA) 10 MG tablet Take 10 mg by mouth daily.     cetirizine (ZYRTEC) 10 MG tablet Take 10 mg by mouth daily.     famotidine (PEPCID) 20 MG tablet Take 20 mg by mouth at bedtime.     metFORMIN (GLUCOPHAGE-XR) 500 MG 24 hr tablet Take 500 mg by mouth in the morning, at noon, in the evening, and at bedtime.     olmesartan (BENICAR) 40 MG tablet Take 40 mg by mouth daily.     pantoprazole (PROTONIX) 40 MG tablet      rosuvastatin (CRESTOR) 20 MG tablet Take 1 tablet (20 mg total) by mouth daily. (Patient taking differently: Take 10 mg by mouth daily.) 15 tablet 0   bisoprolol (ZEBETA) 5 MG tablet      No facility-administered  medications prior to visit.    PAST MEDICAL HISTORY: Past Medical History:  Diagnosis Date   Allergic rhinitis    Chest pain    NON CARDIAC   CKD (chronic kidney disease)    Diabetes mellitus without complication (HCC)    GERD (gastroesophageal reflux disease)    Hyperlipidemia    Hypertension    Paresthesia    face   Sleep apnea     PAST SURGICAL HISTORY: Past Surgical History:  Procedure Laterality Date   CYSTECTOMY     LEFT WRIST    FAMILY HISTORY: Family History  Problem Relation Age of Onset   Hypertension Mother    Stroke Mother    Diabetes Mother    Heart disease Mother    Heart disease Father    Stroke Father    Heart attack Father    Stroke Brother    Heart disease Brother    Pneumonia Brother    Stroke Brother    Heart disease Brother    Heart attack Brother     SOCIAL HISTORY: Social History   Socioeconomic History   Marital status: Married    Spouse name: Not on file   Number of children: Not on file   Years of education: Not on file  Highest education level: Not on file  Occupational History   Not on file  Tobacco Use   Smoking status: Former    Years: 15.00    Types: Cigarettes    Quit date: 2000    Years since quitting: 23.8   Smokeless tobacco: Never  Vaping Use   Vaping Use: Never used  Substance and Sexual Activity   Alcohol use: Not Currently   Drug use: No   Sexual activity: Not on file  Other Topics Concern   Not on file  Social History Narrative   Lives with wife   Social Determinants of Health   Financial Resource Strain: Not on file  Food Insecurity: Not on file  Transportation Needs: Not on file  Physical Activity: Not on file  Stress: Not on file  Social Connections: Not on file  Intimate Partner Violence: Not on file     PHYSICAL EXAM  GENERAL EXAM/CONSTITUTIONAL: Vitals:  Vitals:   09/10/22 0838  BP: (!) 143/70  Pulse: 64  Weight: 226 lb (102.5 kg)  Height: _0  (1.778 m)   Body mass index  is 32.43 kg/m. Wt Readings from Last 3 Encounters:  09/10/22 226 lb (102.5 kg)  05/24/22 229 lb 6.4 oz (104.1 kg)  02/13/22 232 lb (105.2 kg)   Patient is in no distress; well developed, nourished and groomed; neck is supple  CARDIOVASCULAR: Examination of carotid arteries is normal; no carotid bruits Regular rate and rhythm, no murmurs Examination of peripheral vascular system by observation and palpation is normal  EYES: Ophthalmoscopic exam of optic discs and posterior segments is normal; no papilledema or hemorrhages No results found.  MUSCULOSKELETAL: Gait, strength, tone, movements noted in Neurologic exam below  NEUROLOGIC: MENTAL STATUS:      No data to display         awake, alert, oriented to person, place and time recent and remote memory intact normal attention and concentration language fluent, comprehension intact, naming intact fund of knowledge appropriate  CRANIAL NERVE:  2nd - no papilledema on fundoscopic exam 2nd, 3rd, 4th, 6th - pupils equal and reactive to light, visual fields full to confrontation, extraocular muscles intact, no nystagmus 5th - facial sensation symmetric 7th - facial strength symmetric 8th - hearing intact 9th - palate elevates symmetrically, uvula midline 11th - shoulder shrug symmetric 12th - tongue protrusion midline  MOTOR:  normal bulk and tone, full strength in the BUE, BLE  SENSORY:  normal and symmetric to light touch, temperature, vibration  COORDINATION:  finger-nose-finger, fine finger movements normal  REFLEXES:  deep tendon reflexes present and symmetric  GAIT/STATION:  narrow based gait     DIAGNOSTIC DATA (LABS, IMAGING, TESTING) - I reviewed patient records, labs, notes, testing and imaging myself where available.  Lab Results  Component Value Date   WBC 5.1 05/30/2009   HGB 14.8 05/30/2009   HCT 44.3 05/30/2009   MCV 92.2 05/30/2009   PLT 252 05/30/2009      Component Value Date/Time    NA 138 05/30/2009 1905   K 3.6 05/30/2009 1905   CL 102 05/30/2009 1905   CO2 30 05/30/2009 1905   GLUCOSE 95 05/30/2009 1905   BUN 19 05/30/2009 1905   CREATININE 1.28 05/30/2009 1905   CALCIUM 9.8 05/30/2009 1905   GFRNONAA 58 (L) 05/30/2009 1905   GFRAA  05/30/2009 1905    >60        The eGFR has been calculated using the MDRD equation. This calculation has not been  validated in all clinical situations. eGFR's persistently <60 mL/min signify possible Chronic Kidney Disease.   Lab Results  Component Value Date   CHOL (H) 05/31/2009    214        ATP III CLASSIFICATION:  <200     mg/dL   Desirable  200-239  mg/dL   Borderline High  >=240    mg/dL   High          HDL 48 05/31/2009   LDLCALC (H) 05/31/2009    141        Total Cholesterol/HDL:CHD Risk Coronary Heart Disease Risk Table                     Men   Women  1/2 Average Risk   3.4   3.3  Average Risk       5.0   4.4  2 X Average Risk   9.6   7.1  3 X Average Risk  23.4   11.0        Use the calculated Patient Ratio above and the CHD Risk Table to determine the patient's CHD Risk.        ATP III CLASSIFICATION (LDL):  <100     mg/dL   Optimal  100-129  mg/dL   Near or Above                    Optimal  130-159  mg/dL   Borderline  160-189  mg/dL   High  >190     mg/dL   Very High   TRIG 127 05/31/2009   CHOLHDL 4.5 05/31/2009   No results found for: "HGBA1C" No results found for: "VITAMINB12" No results found for: "TSH"  July 2023 B12 175  Feb 2023 A1c 8.7   ASSESSMENT AND PLAN  71 y.o. year old male here with intermittent left face, neck and shoulder numbness.   Dx:  1. Numbness and tingling     PLAN:  Transient left face, neck, shoulder numbness (ddx: TIA, partial seizure or migraine phenomenon) - MRI brain, carotid u/s, EEG - continue aspirin, statin, BP and DM control  Orders Placed This Encounter  Procedures   MR BRAIN W WO CONTRAST   EEG adult   VAS US CAROTID   Return  for pending test results, pending if symptoms worsen or fail to improve.  I reviewed images, labs, notes, records myself. I summarized findings and reviewed with patient, for this high risk condition (TIA evaluation) requiring high complexity decision making.   Penni Bombard, MD 94/02/7394, 8:44 AM Certified in Neurology, Neurophysiology and Neuroimaging  [facility] [address] [place], [place] [address] [phone]

## 2022-09-12 DIAGNOSIS — R931 Abnormal findings on diagnostic imaging of heart and coronary circulation: Secondary | ICD-10-CM | POA: Diagnosis not present

## 2022-09-12 DIAGNOSIS — I10 Essential (primary) hypertension: Secondary | ICD-10-CM | POA: Diagnosis not present

## 2022-09-12 DIAGNOSIS — R7989 Other specified abnormal findings of blood chemistry: Secondary | ICD-10-CM | POA: Diagnosis not present

## 2022-09-16 ENCOUNTER — Encounter (HOSPITAL_COMMUNITY): Payer: Medicare PPO

## 2022-09-23 ENCOUNTER — Telehealth: Payer: Self-pay | Admitting: Diagnostic Neuroimaging

## 2022-09-23 NOTE — Telephone Encounter (Signed)
Francine Graven Berkley Harvey: [phone] exp. 09/23/22-10/23/22 for GI they have already called him twice to try to schedule. [phone]

## 2022-10-08 ENCOUNTER — Ambulatory Visit: Payer: Medicare PPO | Admitting: Diagnostic Neuroimaging

## 2022-10-08 DIAGNOSIS — R200 Anesthesia of skin: Secondary | ICD-10-CM

## 2022-10-08 DIAGNOSIS — R253 Fasciculation: Secondary | ICD-10-CM | POA: Diagnosis not present

## 2022-10-14 ENCOUNTER — Ambulatory Visit (HOSPITAL_COMMUNITY)
Admission: RE | Admit: 2022-10-14 | Discharge: 2022-10-14 | Disposition: A | Discharge status: Home / Self Care | Payer: Medicare PPO | Source: Ambulatory Visit | Attending: Diagnostic Neuroimaging | Admitting: Diagnostic Neuroimaging

## 2022-10-14 DIAGNOSIS — R202 Paresthesia of skin: Secondary | ICD-10-CM | POA: Diagnosis not present

## 2022-10-14 DIAGNOSIS — R200 Anesthesia of skin: Secondary | ICD-10-CM | POA: Diagnosis not present

## 2022-11-13 NOTE — Procedures (Signed)
   [facility]  EEG (ELECTROENCEPHALOGRAM) REPORT   STUDY DATE: 10/08/22 PATIENT NAME: [name] Holt DOB: June 15, 1951 MRN: [id number]  ORDERING CLINICIAN: Andrey Spearman, MD  TECHNOLOGIST: Myer Peer TECHNIQUE: Electroencephalogram was recorded utilizing standard 10-20 system of lead placement and reformatted into average and bipolar montages.  RECORDING TIME: 23 minutes ACTIVATION: hyperventilation and photic stimulation  CLINICAL INFORMATION: 72 year old male with left sided numbness.  FINDINGS: Posterior dominant background rhythms, which attenuate with eye opening, ranging 9-10 hertz and 55-60 microvolts. No focal, lateralizing, epileptiform activity or seizures are seen. Patient recorded in the awake and drowsy state. EKG channel shows regular rhythm of 55-60 beats per minute.   IMPRESSION:   Normal EEG in the awake and drowsy states.   INTERPRETING PHYSICIAN:  Penni Bombard, MD Certified in Neurology, Neurophysiology and Neuroimaging  [facility] [address], [place], [place] [address] [phone]

## 2022-12-17 DIAGNOSIS — I10 Essential (primary) hypertension: Secondary | ICD-10-CM | POA: Diagnosis not present

## 2022-12-17 DIAGNOSIS — E7800 Pure hypercholesterolemia, unspecified: Secondary | ICD-10-CM | POA: Diagnosis not present

## 2022-12-17 DIAGNOSIS — R931 Abnormal findings on diagnostic imaging of heart and coronary circulation: Secondary | ICD-10-CM | POA: Diagnosis not present

## 2022-12-17 DIAGNOSIS — E1169 Type 2 diabetes mellitus with other specified complication: Secondary | ICD-10-CM | POA: Diagnosis not present

## 2023-01-14 DIAGNOSIS — I10 Essential (primary) hypertension: Secondary | ICD-10-CM | POA: Diagnosis not present

## 2023-01-14 DIAGNOSIS — E1169 Type 2 diabetes mellitus with other specified complication: Secondary | ICD-10-CM | POA: Diagnosis not present

## 2023-01-14 DIAGNOSIS — E7800 Pure hypercholesterolemia, unspecified: Secondary | ICD-10-CM | POA: Diagnosis not present

## 2023-02-09 IMAGING — DX DG CHEST 2V
2 series · 2 of 2 positions shown · non-contrast
Comparison: None.

CLINICAL DATA: Shortness of breath

EXAM:
CHEST - 2 VIEW

[chest pa]
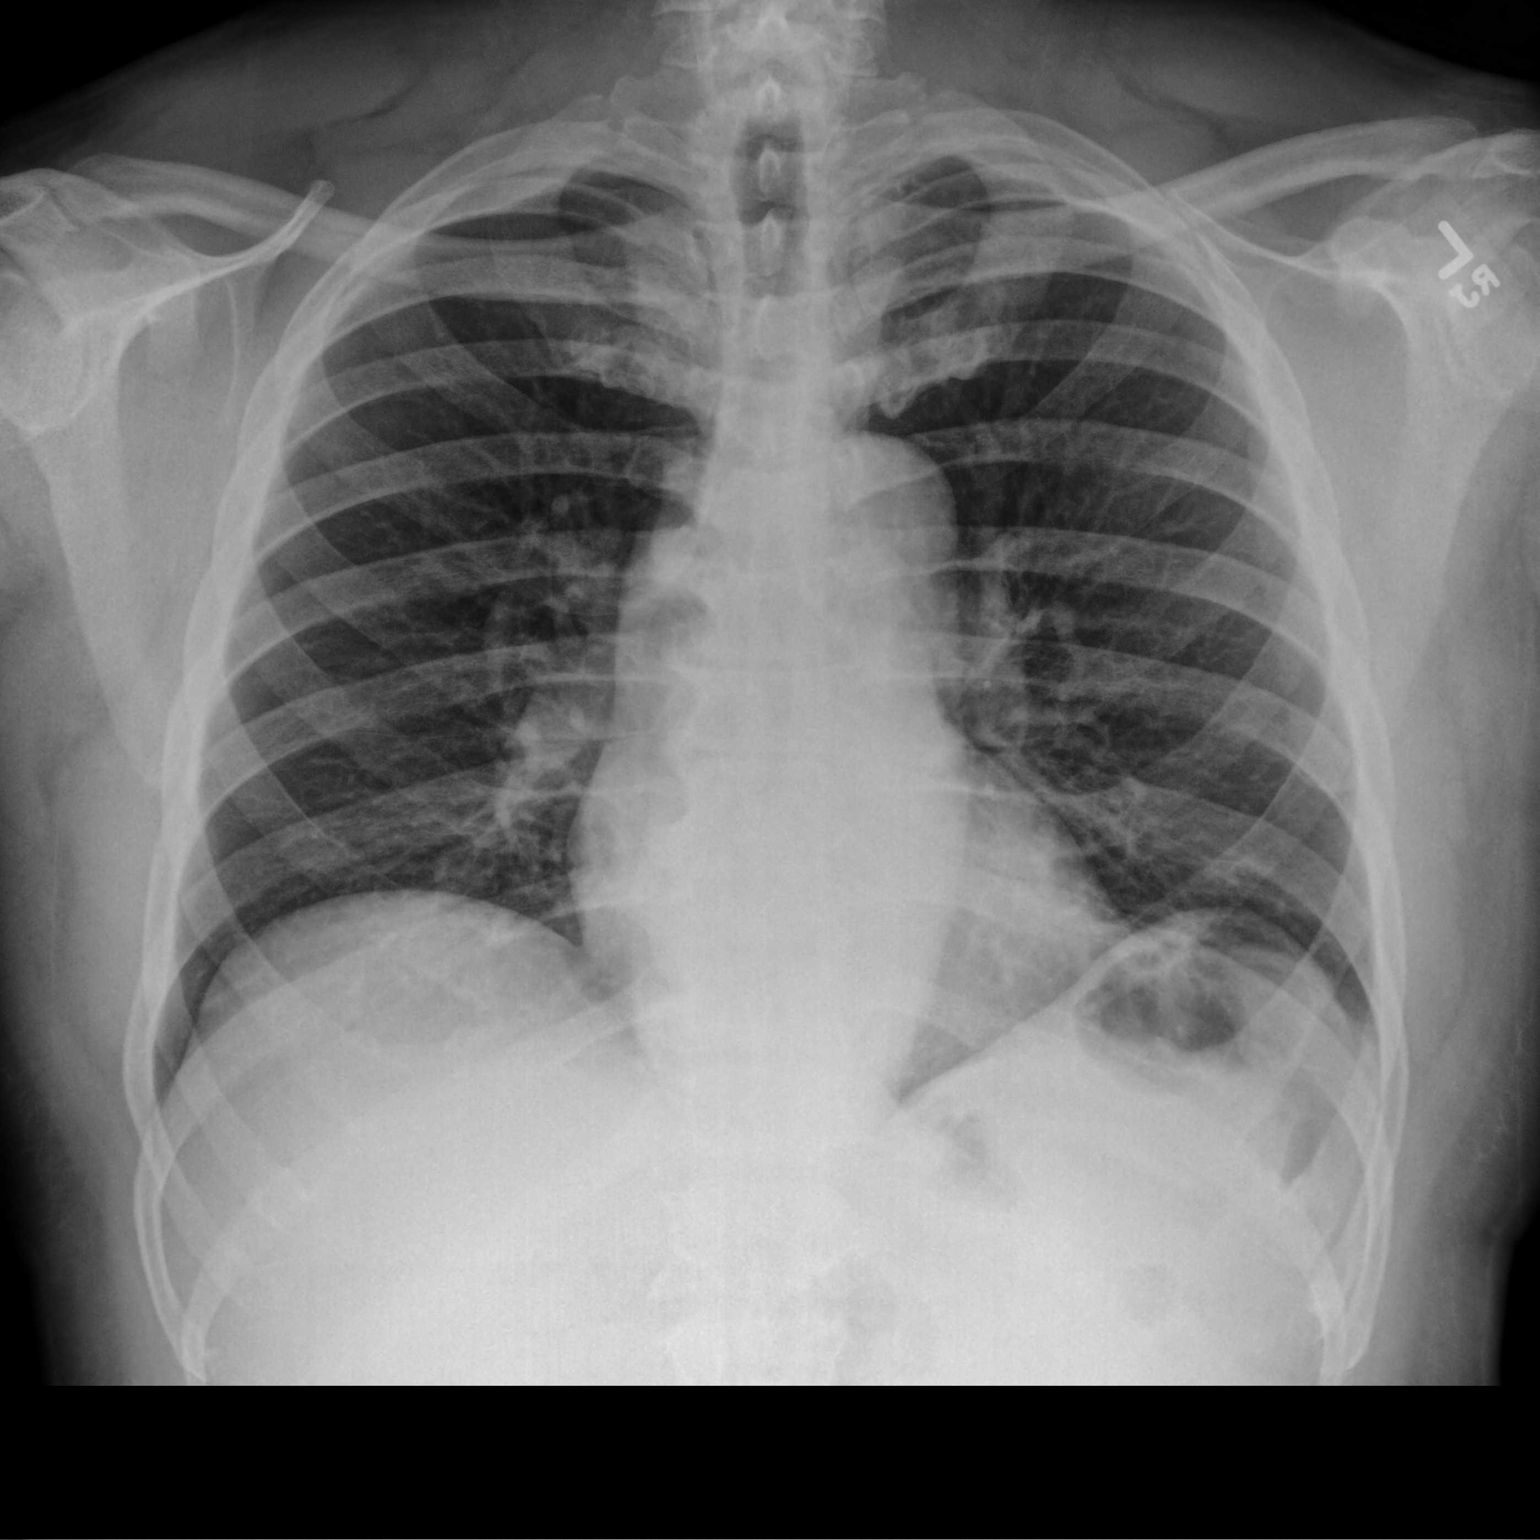

[chest lat]
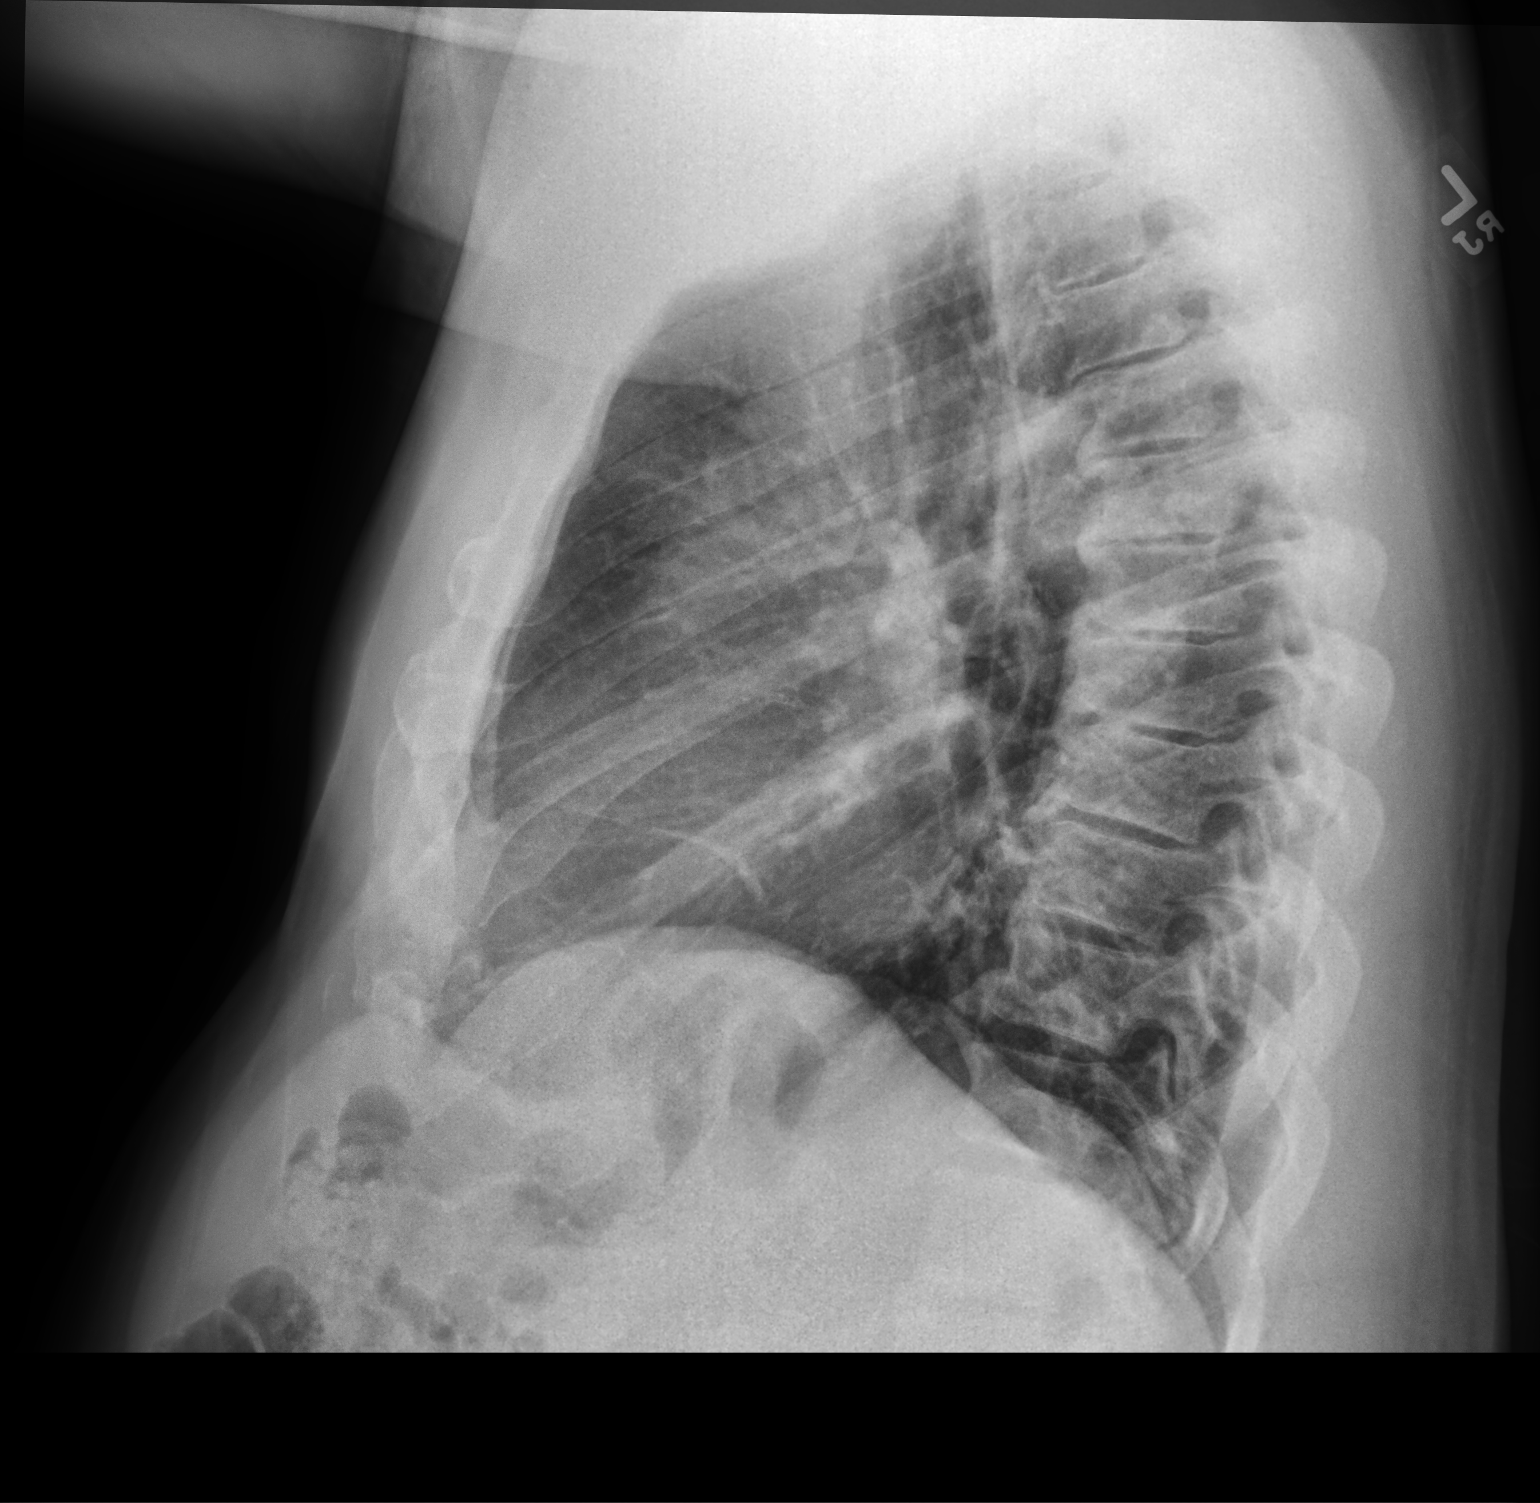

[2 of 2 positions shown; findings below may reference images not displayed]

FINDINGS: Unchanged cardiomediastinal silhouette. Left medial basilar opacity.
No pleural effusion or pneumothorax. No acute osseous abnormality.
IMPRESSION: Left medial basilar opacity which could be developing infection.

## 2023-03-18 DIAGNOSIS — R931 Abnormal findings on diagnostic imaging of heart and coronary circulation: Secondary | ICD-10-CM | POA: Diagnosis not present

## 2023-03-18 DIAGNOSIS — E1169 Type 2 diabetes mellitus with other specified complication: Secondary | ICD-10-CM | POA: Diagnosis not present

## 2023-03-18 DIAGNOSIS — I10 Essential (primary) hypertension: Secondary | ICD-10-CM | POA: Diagnosis not present

## 2023-03-18 DIAGNOSIS — E7800 Pure hypercholesterolemia, unspecified: Secondary | ICD-10-CM | POA: Diagnosis not present

## 2023-05-07 DIAGNOSIS — I10 Essential (primary) hypertension: Secondary | ICD-10-CM | POA: Diagnosis not present

## 2023-05-07 DIAGNOSIS — Z125 Encounter for screening for malignant neoplasm of prostate: Secondary | ICD-10-CM | POA: Diagnosis not present

## 2023-05-09 ENCOUNTER — Ambulatory Visit (HOSPITAL_COMMUNITY): Payer: Medicare PPO | Attending: Cardiology

## 2023-05-09 DIAGNOSIS — I350 Nonrheumatic aortic (valve) stenosis: Secondary | ICD-10-CM | POA: Insufficient documentation

## 2023-05-09 LAB — ECHOCARDIOGRAM COMPLETE
AR max vel: 3.01 cm2
AV Area VTI: 3.06 cm2
AV Area mean vel: 2.83 cm2
AV Mean grad: 13 mmHg
AV Peak grad: 24.1 mmHg
Ao pk vel: 2.46 m/s
Area-P 1/2: 2.85 cm2
P 1/2 time: 533 msec
S' Lateral: 3 cm

## 2023-05-12 DIAGNOSIS — I10 Essential (primary) hypertension: Secondary | ICD-10-CM | POA: Diagnosis not present

## 2023-05-12 DIAGNOSIS — Z1212 Encounter for screening for malignant neoplasm of rectum: Secondary | ICD-10-CM | POA: Diagnosis not present

## 2023-05-12 DIAGNOSIS — Z0000 Encounter for general adult medical examination without abnormal findings: Secondary | ICD-10-CM | POA: Diagnosis not present

## 2023-05-12 DIAGNOSIS — K219 Gastro-esophageal reflux disease without esophagitis: Secondary | ICD-10-CM | POA: Diagnosis not present

## 2023-05-12 DIAGNOSIS — Z86010 Personal history of colonic polyps: Secondary | ICD-10-CM | POA: Diagnosis not present

## 2023-05-12 DIAGNOSIS — I2510 Atherosclerotic heart disease of native coronary artery without angina pectoris: Secondary | ICD-10-CM | POA: Diagnosis not present

## 2023-05-12 DIAGNOSIS — G4733 Obstructive sleep apnea (adult) (pediatric): Secondary | ICD-10-CM | POA: Diagnosis not present

## 2023-05-12 DIAGNOSIS — I444 Left anterior fascicular block: Secondary | ICD-10-CM | POA: Diagnosis not present

## 2023-05-12 DIAGNOSIS — Z23 Encounter for immunization: Secondary | ICD-10-CM | POA: Diagnosis not present

## 2023-05-12 DIAGNOSIS — E1169 Type 2 diabetes mellitus with other specified complication: Secondary | ICD-10-CM | POA: Diagnosis not present

## 2023-05-13 ENCOUNTER — Ambulatory Visit: Payer: Medicare PPO | Admitting: Pulmonary Disease

## 2023-05-13 ENCOUNTER — Encounter: Payer: Self-pay | Admitting: Pulmonary Disease

## 2023-05-13 VITALS — BP 122/62 | HR 66 | Ht 70.0 in | Wt 226.0 lb

## 2023-05-13 DIAGNOSIS — G4733 Obstructive sleep apnea (adult) (pediatric): Secondary | ICD-10-CM | POA: Diagnosis not present

## 2023-05-13 NOTE — Progress Notes (Signed)
[name] Holt    [id number]    February 14, 1951  Primary Care Physician:Pharr, Zollie Beckers, MD  Referring Physician: Merri Brunette, MD 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   bisoprolol (ZEBETA) 10 MG tablet Take 10 mg by mouth daily.   cetirizine (ZYRTEC) 10 MG tablet Take 10 mg by mouth daily.   cyanocobalamin 100 MCG tablet as directed Orally   famotidine (PEPCID) 20 MG tablet Take 20 mg by mouth at bedtime.   metFORMIN (GLUCOPHAGE-XR) 500 MG 24 hr tablet Take 500 mg by mouth in the morning, at noon, in the evening, and at bedtime.   olmesartan-hydrochlorothiazide (BENICAR HCT) 40-12.5 MG tablet 1 tablet Orally Once a day   pantoprazole (PROTONIX) 40 MG tablet    rosuvastatin (CRESTOR) 20 MG tablet Take 1 tablet (20 mg total) by mouth daily. (Patient taking differently: Take 10 mg by mouth daily.)   olmesartan (BENICAR) 40 MG tablet Take 40 mg by mouth daily. (Patient not taking: Reported on 05/13/2023)   No facility-administered encounter medications on file as of 05/13/2023.    Allergies as of 05/13/2023    (No Known Allergies)    Past Medical History:  Diagnosis Date   Allergic rhinitis    Chest pain    NON CARDIAC   CKD (chronic kidney disease)    Diabetes mellitus without complication (HCC)    GERD (gastroesophageal reflux disease)    Hyperlipidemia    Hypertension    Paresthesia    face   Sleep apnea     Past Surgical History:  Procedure Laterality Date   CYSTECTOMY     LEFT WRIST    Family History  Problem Relation Age of Onset   Hypertension Mother    Stroke Mother    Diabetes Mother    Heart disease Mother    Heart disease Father    Stroke Father    Heart attack Father    Stroke Brother    Heart disease Brother    Pneumonia Brother    Stroke Brother    Heart disease Brother    Heart attack Brother     Social History   Socioeconomic History   Marital status: Married    Spouse name: Not on file   Number of children: Not on file   Years of education: Not on file   Highest education level: Not on file  Occupational History   Not on file  Tobacco Use   Smoking status: Former    Years: 15    Types: Cigarettes    Quit date: 2000  Years since quitting: 24.5   Smokeless tobacco: Never  Vaping Use   Vaping Use: Never used  Substance and Sexual Activity   Alcohol use: Not Currently   Drug use: No   Sexual activity: Not on file  Other Topics Concern   Not on file  Social History Narrative   Lives with wife   Social Determinants of Health   Financial Resource Strain: Not on file  Food Insecurity: Not on file  Transportation Needs: Not on file  Physical Activity: Not on file  Stress: Not on file  Social Connections: Not on file  Intimate Partner Violence: Not on file    Review of Systems  Respiratory:  Positive for apnea.   Psychiatric/Behavioral:  Positive for sleep disturbance.     Vitals:   05/13/23 1457  BP: 122/62  Pulse: 66  SpO2: 96%     Physical Exam Constitutional:      Appearance: He is obese.  HENT:     Nose: No  congestion or rhinorrhea.     Mouth/Throat:     Comments: Mallampati 4, macroglossia Cardiovascular:     Rate and Rhythm: Normal rate and regular rhythm.     Heart sounds: No murmur heard.    No friction rub.  Pulmonary:     Effort: No respiratory distress.     Breath sounds: No stridor. No wheezing or rhonchi.  Musculoskeletal:        General: Normal range of motion.     Cervical back: No rigidity or tenderness.  Neurological:     Mental Status: He is alert.  Psychiatric:        Mood and Affect: Mood normal.       03/15/2021    1:00 PM  Results of the Epworth flowsheet  Sitting and reading 2  Watching TV 2  Sitting, inactive in a public place (e.g. a theatre or a meeting) 2  As a passenger in a car for an hour without a break 3  Lying down to rest in the afternoon when circumstances permit 0  Sitting and talking to someone 1  Sitting quietly after a lunch without alcohol 3  In a car, while stopped for a few minutes in traffic 0  Total score 13     Data Reviewed: Echocardiogram February 2021 shows severe left ventricular hypertrophy, normal ejection fraction, moderately elevated pulmonary artery systolic pressure Repeat echocardiogram shows normal pulmonary pressures  Compliance data reviewed showing 89% compliance Average use of 4 hours 52 minutes Minimum pressure 5, maximum pressure of 20 95 percentile pressure of 13.3 Residual AHI of 5.4  Assessment:  Obstructive sleep apnea with good control of symptoms -Continues to tolerate CPAP well  Class I obesity -Continue weight loss efforts  Plan/Recommendations:  Continue CPAP on a nightly basis  The download shows his machine is working well  Call with significant concerns  Follow-up a year from now   Virl Diamond MD [facility] 05/13/2023, 3:03 PM  CC: Merri Brunette, MD

## 2023-05-13 NOTE — Patient Instructions (Signed)
I will see you back in about 1 year  Continues on CPAP on a nightly basis  Call us with significant concerns  The download from your machine shows it is working well

## 2023-05-15 DIAGNOSIS — G4733 Obstructive sleep apnea (adult) (pediatric): Secondary | ICD-10-CM | POA: Diagnosis not present

## 2023-08-01 DIAGNOSIS — Z86010 Personal history of colonic polyps: Secondary | ICD-10-CM | POA: Diagnosis not present

## 2023-08-01 DIAGNOSIS — K635 Polyp of colon: Secondary | ICD-10-CM | POA: Diagnosis not present

## 2023-08-01 DIAGNOSIS — K5730 Diverticulosis of large intestine without perforation or abscess without bleeding: Secondary | ICD-10-CM | POA: Diagnosis not present

## 2023-08-01 DIAGNOSIS — Z09 Encounter for follow-up examination after completed treatment for conditions other than malignant neoplasm: Secondary | ICD-10-CM | POA: Diagnosis not present

## 2023-08-01 DIAGNOSIS — K648 Other hemorrhoids: Secondary | ICD-10-CM | POA: Diagnosis not present

## 2023-08-05 DIAGNOSIS — K635 Polyp of colon: Secondary | ICD-10-CM | POA: Diagnosis not present

## 2023-08-19 DIAGNOSIS — E7800 Pure hypercholesterolemia, unspecified: Secondary | ICD-10-CM | POA: Diagnosis not present

## 2023-08-19 DIAGNOSIS — I10 Essential (primary) hypertension: Secondary | ICD-10-CM | POA: Diagnosis not present

## 2023-08-19 DIAGNOSIS — E1169 Type 2 diabetes mellitus with other specified complication: Secondary | ICD-10-CM | POA: Diagnosis not present

## 2023-08-19 DIAGNOSIS — R931 Abnormal findings on diagnostic imaging of heart and coronary circulation: Secondary | ICD-10-CM | POA: Diagnosis not present

## 2023-11-20 DIAGNOSIS — E7800 Pure hypercholesterolemia, unspecified: Secondary | ICD-10-CM | POA: Diagnosis not present

## 2023-11-20 DIAGNOSIS — R3589 Other polyuria: Secondary | ICD-10-CM | POA: Diagnosis not present

## 2023-11-20 DIAGNOSIS — E1169 Type 2 diabetes mellitus with other specified complication: Secondary | ICD-10-CM | POA: Diagnosis not present

## 2023-11-20 DIAGNOSIS — I10 Essential (primary) hypertension: Secondary | ICD-10-CM | POA: Diagnosis not present

## 2023-11-20 DIAGNOSIS — I2510 Atherosclerotic heart disease of native coronary artery without angina pectoris: Secondary | ICD-10-CM | POA: Diagnosis not present

## 2023-11-20 DIAGNOSIS — R931 Abnormal findings on diagnostic imaging of heart and coronary circulation: Secondary | ICD-10-CM | POA: Diagnosis not present

## 2024-01-07 DIAGNOSIS — R3912 Poor urinary stream: Secondary | ICD-10-CM | POA: Diagnosis not present

## 2024-01-07 DIAGNOSIS — R350 Frequency of micturition: Secondary | ICD-10-CM | POA: Diagnosis not present

## 2024-01-07 DIAGNOSIS — R351 Nocturia: Secondary | ICD-10-CM | POA: Diagnosis not present

## 2024-01-07 DIAGNOSIS — N401 Enlarged prostate with lower urinary tract symptoms: Secondary | ICD-10-CM | POA: Diagnosis not present

## 2024-02-19 DIAGNOSIS — E7800 Pure hypercholesterolemia, unspecified: Secondary | ICD-10-CM | POA: Diagnosis not present

## 2024-02-19 DIAGNOSIS — E1169 Type 2 diabetes mellitus with other specified complication: Secondary | ICD-10-CM | POA: Diagnosis not present

## 2024-02-19 DIAGNOSIS — I10 Essential (primary) hypertension: Secondary | ICD-10-CM | POA: Diagnosis not present

## 2024-02-26 DIAGNOSIS — E7800 Pure hypercholesterolemia, unspecified: Secondary | ICD-10-CM | POA: Diagnosis not present

## 2024-02-26 DIAGNOSIS — I2510 Atherosclerotic heart disease of native coronary artery without angina pectoris: Secondary | ICD-10-CM | POA: Diagnosis not present

## 2024-02-26 DIAGNOSIS — E1169 Type 2 diabetes mellitus with other specified complication: Secondary | ICD-10-CM | POA: Diagnosis not present

## 2024-02-26 DIAGNOSIS — N1831 Chronic kidney disease, stage 3a: Secondary | ICD-10-CM | POA: Diagnosis not present

## 2024-02-26 DIAGNOSIS — I10 Essential (primary) hypertension: Secondary | ICD-10-CM | POA: Diagnosis not present

## 2024-02-26 DIAGNOSIS — K219 Gastro-esophageal reflux disease without esophagitis: Secondary | ICD-10-CM | POA: Diagnosis not present

## 2024-02-26 DIAGNOSIS — R931 Abnormal findings on diagnostic imaging of heart and coronary circulation: Secondary | ICD-10-CM | POA: Diagnosis not present

## 2024-05-10 DIAGNOSIS — Z125 Encounter for screening for malignant neoplasm of prostate: Secondary | ICD-10-CM | POA: Diagnosis not present

## 2024-05-10 DIAGNOSIS — I10 Essential (primary) hypertension: Secondary | ICD-10-CM | POA: Diagnosis not present

## 2024-05-14 DIAGNOSIS — G4733 Obstructive sleep apnea (adult) (pediatric): Secondary | ICD-10-CM | POA: Diagnosis not present

## 2024-05-14 DIAGNOSIS — R001 Bradycardia, unspecified: Secondary | ICD-10-CM | POA: Diagnosis not present

## 2024-05-14 DIAGNOSIS — R413 Other amnesia: Secondary | ICD-10-CM | POA: Diagnosis not present

## 2024-05-14 DIAGNOSIS — I10 Essential (primary) hypertension: Secondary | ICD-10-CM | POA: Diagnosis not present

## 2024-05-14 DIAGNOSIS — E1169 Type 2 diabetes mellitus with other specified complication: Secondary | ICD-10-CM | POA: Diagnosis not present

## 2024-05-14 DIAGNOSIS — Z0000 Encounter for general adult medical examination without abnormal findings: Secondary | ICD-10-CM | POA: Diagnosis not present

## 2024-05-14 DIAGNOSIS — I2510 Atherosclerotic heart disease of native coronary artery without angina pectoris: Secondary | ICD-10-CM | POA: Diagnosis not present

## 2024-05-14 DIAGNOSIS — N401 Enlarged prostate with lower urinary tract symptoms: Secondary | ICD-10-CM | POA: Diagnosis not present

## 2024-05-14 DIAGNOSIS — Z6833 Body mass index (BMI) 33.0-33.9, adult: Secondary | ICD-10-CM | POA: Diagnosis not present

## 2024-05-24 DIAGNOSIS — R7989 Other specified abnormal findings of blood chemistry: Secondary | ICD-10-CM | POA: Diagnosis not present

## 2024-05-24 DIAGNOSIS — I10 Essential (primary) hypertension: Secondary | ICD-10-CM | POA: Diagnosis not present

## 2024-05-24 DIAGNOSIS — R413 Other amnesia: Secondary | ICD-10-CM | POA: Diagnosis not present

## 2024-06-02 DIAGNOSIS — E876 Hypokalemia: Secondary | ICD-10-CM | POA: Diagnosis not present

## 2024-06-03 ENCOUNTER — Other Ambulatory Visit: Payer: Self-pay

## 2024-06-03 ENCOUNTER — Inpatient Hospital Stay (HOSPITAL_COMMUNITY)
Admission: EM | Admit: 2024-06-03 | Discharge: 2024-06-08 | DRG: 244 | Disposition: A | Discharge status: Home / Self Care | Attending: Cardiology | Admitting: Cardiology

## 2024-06-03 ENCOUNTER — Encounter (HOSPITAL_COMMUNITY): Payer: Self-pay

## 2024-06-03 ENCOUNTER — Emergency Department (HOSPITAL_COMMUNITY)

## 2024-06-03 DIAGNOSIS — E1122 Type 2 diabetes mellitus with diabetic chronic kidney disease: Secondary | ICD-10-CM | POA: Diagnosis not present

## 2024-06-03 DIAGNOSIS — Z7984 Long term (current) use of oral hypoglycemic drugs: Secondary | ICD-10-CM | POA: Diagnosis not present

## 2024-06-03 DIAGNOSIS — I493 Ventricular premature depolarization: Secondary | ICD-10-CM | POA: Diagnosis present

## 2024-06-03 DIAGNOSIS — R001 Bradycardia, unspecified: Secondary | ICD-10-CM | POA: Diagnosis not present

## 2024-06-03 DIAGNOSIS — I129 Hypertensive chronic kidney disease with stage 1 through stage 4 chronic kidney disease, or unspecified chronic kidney disease: Secondary | ICD-10-CM | POA: Diagnosis present

## 2024-06-03 DIAGNOSIS — I2510 Atherosclerotic heart disease of native coronary artery without angina pectoris: Secondary | ICD-10-CM | POA: Diagnosis not present

## 2024-06-03 DIAGNOSIS — I442 Atrioventricular block, complete: Secondary | ICD-10-CM | POA: Diagnosis not present

## 2024-06-03 DIAGNOSIS — I441 Atrioventricular block, second degree: Secondary | ICD-10-CM | POA: Diagnosis not present

## 2024-06-03 DIAGNOSIS — K219 Gastro-esophageal reflux disease without esophagitis: Secondary | ICD-10-CM | POA: Diagnosis present

## 2024-06-03 DIAGNOSIS — I10 Essential (primary) hypertension: Secondary | ICD-10-CM | POA: Diagnosis not present

## 2024-06-03 DIAGNOSIS — Z833 Family history of diabetes mellitus: Secondary | ICD-10-CM

## 2024-06-03 DIAGNOSIS — Z950 Presence of cardiac pacemaker: Secondary | ICD-10-CM | POA: Diagnosis not present

## 2024-06-03 DIAGNOSIS — R072 Precordial pain: Secondary | ICD-10-CM | POA: Diagnosis not present

## 2024-06-03 DIAGNOSIS — Z7982 Long term (current) use of aspirin: Secondary | ICD-10-CM

## 2024-06-03 DIAGNOSIS — E785 Hyperlipidemia, unspecified: Secondary | ICD-10-CM | POA: Diagnosis not present

## 2024-06-03 DIAGNOSIS — N1831 Chronic kidney disease, stage 3a: Secondary | ICD-10-CM | POA: Diagnosis not present

## 2024-06-03 DIAGNOSIS — I499 Cardiac arrhythmia, unspecified: Secondary | ICD-10-CM | POA: Diagnosis present

## 2024-06-03 DIAGNOSIS — Z87891 Personal history of nicotine dependence: Secondary | ICD-10-CM | POA: Diagnosis not present

## 2024-06-03 DIAGNOSIS — I350 Nonrheumatic aortic (valve) stenosis: Secondary | ICD-10-CM | POA: Diagnosis not present

## 2024-06-03 DIAGNOSIS — Z823 Family history of stroke: Secondary | ICD-10-CM

## 2024-06-03 DIAGNOSIS — G4733 Obstructive sleep apnea (adult) (pediatric): Secondary | ICD-10-CM | POA: Diagnosis present

## 2024-06-03 DIAGNOSIS — E876 Hypokalemia: Secondary | ICD-10-CM | POA: Diagnosis present

## 2024-06-03 DIAGNOSIS — Z8249 Family history of ischemic heart disease and other diseases of the circulatory system: Secondary | ICD-10-CM | POA: Diagnosis not present

## 2024-06-03 DIAGNOSIS — R079 Chest pain, unspecified: Secondary | ICD-10-CM | POA: Diagnosis not present

## 2024-06-03 DIAGNOSIS — Z79899 Other long term (current) drug therapy: Secondary | ICD-10-CM

## 2024-06-03 LAB — I-STAT CHEM 8, ED
BUN: 17 mg/dL (ref 8–23)
Calcium, Ion: 1.24 mmol/L (ref 1.15–1.40)
Chloride: 103 mmol/L (ref 98–111)
Creatinine, Ser: 1.4 mg/dL — ABNORMAL HIGH (ref 0.61–1.24)
Glucose, Bld: 109 mg/dL — ABNORMAL HIGH (ref 70–99)
HCT: 41 % (ref 39.0–52.0)
Hemoglobin: 13.9 g/dL (ref 13.0–17.0)
Potassium: 3.4 mmol/L — ABNORMAL LOW (ref 3.5–5.1)
Sodium: 142 mmol/L (ref 135–145)
TCO2: 29 mmol/L (ref 22–32)

## 2024-06-03 LAB — BASIC METABOLIC PANEL WITH GFR
Anion gap: 8 (ref 5–15)
BUN: 14 mg/dL (ref 8–23)
CO2: 26 mmol/L (ref 22–32)
Calcium: 9.8 mg/dL (ref 8.9–10.3)
Chloride: 105 mmol/L (ref 98–111)
Creatinine, Ser: 1.31 mg/dL — ABNORMAL HIGH (ref 0.61–1.24)
GFR, Estimated: 59 mL/min — ABNORMAL LOW (ref >60)
Glucose, Bld: 109 mg/dL — ABNORMAL HIGH (ref 70–99)
Potassium: 3.7 mmol/L (ref 3.5–5.1)
Sodium: 139 mmol/L (ref 135–145)

## 2024-06-03 LAB — CBC WITH DIFFERENTIAL/PLATELET
Abs Immature Granulocytes: 0.01 K/uL (ref 0.00–0.07)
Basophils Absolute: 0 K/uL (ref 0.0–0.1)
Basophils Relative: 0 %
Eosinophils Absolute: 0 K/uL (ref 0.0–0.5)
Eosinophils Relative: 0 %
HCT: 39.7 % (ref 39.0–52.0)
Hemoglobin: 12.9 g/dL — ABNORMAL LOW (ref 13.0–17.0)
Immature Granulocytes: 0 %
Lymphocytes Relative: 32 %
Lymphs Abs: 1.5 K/uL (ref 0.7–4.0)
MCH: 29.7 pg (ref 26.0–34.0)
MCHC: 32.5 g/dL (ref 30.0–36.0)
MCV: 91.3 fL (ref 80.0–100.0)
Monocytes Absolute: 0.9 K/uL (ref 0.1–1.0)
Monocytes Relative: 18 %
Neutro Abs: 2.4 K/uL (ref 1.7–7.7)
Neutrophils Relative %: 50 %
Platelets: 227 K/uL (ref 150–400)
RBC: 4.35 MIL/uL (ref 4.22–5.81)
RDW: 12 % (ref 11.5–15.5)
WBC: 4.9 K/uL (ref 4.0–10.5)
nRBC: 0 % (ref 0.0–0.2)

## 2024-06-03 LAB — CBG MONITORING, ED: Glucose-Capillary: 93 mg/dL (ref 70–99)

## 2024-06-03 LAB — PROTIME-INR
INR: 1 (ref 0.8–1.2)
Prothrombin Time: 13.4 s (ref 11.4–15.2)

## 2024-06-03 LAB — GLUCOSE, CAPILLARY: Glucose-Capillary: 117 mg/dL — ABNORMAL HIGH (ref 70–99)

## 2024-06-03 LAB — TROPONIN I (HIGH SENSITIVITY)
Troponin I (High Sensitivity): 17 ng/L (ref <18)
Troponin I (High Sensitivity): 17 ng/L (ref <18)

## 2024-06-03 LAB — TSH: TSH: 2.639 u[IU]/mL (ref 0.350–4.500)

## 2024-06-03 LAB — BRAIN NATRIURETIC PEPTIDE: B Natriuretic Peptide: 127.9 pg/mL — ABNORMAL HIGH (ref 0.0–100.0)

## 2024-06-03 MED ORDER — IRBESARTAN 150 MG PO TABS
300.0000 mg | ORAL_TABLET | Freq: Every day | ORAL | Status: DC
  Administered 2024-06-04 – 2024-06-08 (×5): 300 mg via ORAL
  Filled 2024-06-03 (×5): qty 2

## 2024-06-03 MED ORDER — OLMESARTAN MEDOXOMIL-HCTZ 40-12.5 MG PO TABS: 1.0000 | ORAL_TABLET | Freq: Every day | ORAL | Status: DC

## 2024-06-03 MED ORDER — ASPIRIN 81 MG PO TBEC
81.0000 mg | DELAYED_RELEASE_TABLET | Freq: Every day | ORAL | Status: DC
  Administered 2024-06-04 – 2024-06-08 (×5): 81 mg via ORAL
  Filled 2024-06-03 (×5): qty 1

## 2024-06-03 MED ORDER — HYDROCHLOROTHIAZIDE 12.5 MG PO TABS
12.5000 mg | ORAL_TABLET | Freq: Every day | ORAL | Status: DC
  Administered 2024-06-04 – 2024-06-08 (×5): 12.5 mg via ORAL
  Filled 2024-06-03 (×5): qty 1

## 2024-06-03 MED ORDER — AMLODIPINE BESYLATE 10 MG PO TABS
10.0000 mg | ORAL_TABLET | Freq: Every day | ORAL | Status: DC
  Administered 2024-06-04 – 2024-06-08 (×5): 10 mg via ORAL
  Filled 2024-06-03 (×5): qty 1

## 2024-06-03 MED ORDER — ONDANSETRON HCL 4 MG/2ML IJ SOLN: 4.0000 mg | Freq: Four times a day (QID) | INTRAMUSCULAR | Status: DC | PRN

## 2024-06-03 MED ORDER — ROSUVASTATIN CALCIUM 5 MG PO TABS
10.0000 mg | ORAL_TABLET | Freq: Every day | ORAL | Status: DC
  Administered 2024-06-04 – 2024-06-08 (×5): 10 mg via ORAL
  Filled 2024-06-03 (×5): qty 2

## 2024-06-03 MED ORDER — NITROGLYCERIN 0.4 MG SL SUBL: 0.4000 mg | SUBLINGUAL_TABLET | SUBLINGUAL | Status: DC | PRN

## 2024-06-03 MED ORDER — INSULIN ASPART 100 UNIT/ML IJ SOLN
0.0000 [IU] | Freq: Three times a day (TID) | INTRAMUSCULAR | Status: DC
  Administered 2024-06-04: 3 [IU] via SUBCUTANEOUS
  Administered 2024-06-07: 2 [IU] via SUBCUTANEOUS

## 2024-06-03 MED ORDER — HYDRALAZINE HCL 20 MG/ML IJ SOLN
10.0000 mg | INTRAMUSCULAR | Status: DC | PRN
  Administered 2024-06-03 – 2024-06-04 (×2): 10 mg via INTRAVENOUS
  Filled 2024-06-03 (×2): qty 1

## 2024-06-03 MED ORDER — ACETAMINOPHEN 325 MG PO TABS
650.0000 mg | ORAL_TABLET | ORAL | Status: DC | PRN
  Administered 2024-06-07 – 2024-06-08 (×2): 650 mg via ORAL
  Filled 2024-06-03 (×2): qty 2

## 2024-06-03 NOTE — ED Triage Notes (Signed)
 Pt bib POV c/o midsternum cp with sob. Pt pain is heavy in nature. Pt pain has been intermittent for the past month.   Hx HTN  Pt denies N/V, back pain, dizziness, headache, and blurred vision

## 2024-06-03 NOTE — ED Provider Triage Note (Signed)
 Emergency Medicine Provider Triage Evaluation Note  [name] Holt , a 73 y.o. male  was evaluated in triage.  Pt complains of shortness of breath, chest pressure, indigestion and hypertension that started acutely this morning, noticed that his blood pressure was approximately 200+ systolic and with his symptoms decided come in.  Notes that he has been having intermittent shortness of breath and chest pressure times approximately 2 months, worse on exertion.  Denies headache, vision changes, hemoptysis, cough, abdominal pain, nausea, vomiting, diarrhea, hematochezia, melena, dysuria, lower leg swelling.  Review of Systems  Positive: N/a Negative: N/a  Physical Exam  BP (!) 208/54   Pulse (!) 42   Temp 97.6 F (36.4 C)   Resp 16   SpO2 100%  Gen:   Awake, no distress   Resp:  Normal effort  MSK:   Moves extremities without difficulty  Other:    Medical Decision Making  Medically screening exam initiated at 2:14 PM.  Appropriate orders placed.  [name] Holt was informed that the remainder of the evaluation will be completed by another provider, this initial triage assessment does not replace that evaluation, and the importance of remaining in the ED until their evaluation is complete.     Beola Terrall RAMAN, [place] 06/03/24 [phone]

## 2024-06-03 NOTE — ED Notes (Signed)
 Admission screenings completed at this time.  Wife/ Wanda at bedside with patient.    06/03/24 1608  Patient Belongings  Patient/Family advised about valuables policy? Yes  Home Medications No meds brought to hospital  Patient Belongings Kept at bedside  Belongings at Bedside Glasses;Electronic device(s);Purse/Wallet  Bedside: Electronic Device(s) Cellphone

## 2024-06-03 NOTE — ED Triage Notes (Addendum)
 POV/ ambulatory/ with family/ c/o CP and SHOB since this am/ seen for this issue before but does not remember his dx/ pt has no other complaints/ pt is very vague with answers/ hx of HTN

## 2024-06-03 NOTE — ED Provider Notes (Signed)
 [facility] Provider Note   CSN: [id number] Arrival date & time: 06/03/24  1356     Patient presents with: Chest Pain   Yonathan Perrow is a 73 y.o. male past medical history significant for hyperlipidemia, hypertension, type 2 diabetes, CKD who presents emergency department for chest pain.  Patient states that he has experienced months of intermittent chest pain with associated shortness of breath.  Patient states that he has not had this chest pain or shortness of breath evaluated prior to today however was noted to have hypertension at home which caused him to present to the emergency department.  Patient denies syncope    Chest Pain      Prior to Admission medications   Medication Sig Start Date End Date Taking? Authorizing Provider  amLODipine  (NORVASC ) 10 MG tablet TAKE 1 TABLET BY MOUTH EVERY DAY **MUST SCHEDULE APPT FOR FUTURE REFILLS** 06/11/21  Yes O'Neal, Darryle Ned, MD  aspirin  81 MG EC tablet Take 81 mg by mouth daily. 09/14/19  Yes [provider]  bisoprolol (ZEBETA) 10 MG tablet Take 10 mg by mouth daily. 08/06/22  Yes [provider]  cetirizine (ZYRTEC) 10 MG tablet Take 10 mg by mouth daily as needed for allergies. 05/19/12  Yes [provider]  cyanocobalamin  100 MCG tablet Take 100 mcg by mouth daily.   Yes [provider]  famotidine (PEPCID) 20 MG tablet Take 20 mg by mouth at bedtime.   Yes [provider]  metFORMIN (GLUCOPHAGE-XR) 500 MG 24 hr tablet Take 500 mg by mouth in the morning and at bedtime. 01/01/22  Yes [provider]  olmesartan -hydrochlorothiazide  (BENICAR  HCT) 40-12.5 MG tablet 1 tablet Orally Once a day   Yes [provider]  pantoprazole (PROTONIX) 40 MG tablet Take 40 mg by mouth daily. 12/13/19  Yes [provider]  rosuvastatin  (CRESTOR ) 20 MG tablet Take 20 mg by mouth every evening.   Yes [provider]   tamsulosin (FLOMAX) 0.4 MG CAPS capsule Take 0.4 mg by mouth daily. 06/02/24  Yes [provider]    Allergies: Patient has no known allergies.    Review of Systems  Cardiovascular:  Positive for chest pain.  Reviewed in HPI  Updated Vital Signs BP (!) 134/48   Pulse (!) 36   Temp 97.6 F (36.4 C)   Resp 11   Ht 5' 10 (1.778 m)   Wt 99.8 kg   SpO2 99%   BMI 31.57 kg/m   Physical Exam  (all labs ordered are listed, but only abnormal results are displayed) Labs Reviewed  CBC WITH DIFFERENTIAL/PLATELET - Abnormal; Notable for the following components:      Result Value   Hemoglobin 12.9 (*)    All other components within normal limits  BASIC METABOLIC PANEL WITH GFR - Abnormal; Notable for the following components:   Glucose, Bld 109 (*)    Creatinine, Ser 1.31 (*)    GFR, Estimated 59 (*)    All other components within normal limits  BRAIN NATRIURETIC PEPTIDE - Abnormal; Notable for the following components:   B Natriuretic Peptide 127.9 (*)    All other components within normal limits  I-STAT CHEM 8, ED - Abnormal; Notable for the following components:   Potassium 3.4 (*)    Creatinine, Ser 1.40 (*)    Glucose, Bld 109 (*)    All other components within normal limits  PROTIME-INR  HEMOGLOBIN A1C  TSH  TROPONIN I (HIGH  SENSITIVITY)  TROPONIN I (HIGH SENSITIVITY)    EKG: EKG Interpretation Date/Time:  Thursday June 03 2024 14:07:00 EDT Ventricular Rate:  41 PR Interval:    QRS Duration:  120 QT Interval:  470 QTC Calculation: 387 R Axis:   220  Text Interpretation: Critical Test Result: AV Block -Suspect arm lead reversal, interpretation assumes no reversal Sinus bradycardia with A-V dissociation and Wide QRS rhythm Right superior axis deviation Pulmonary disease pattern Non-specific intra-ventricular conduction delay Minimal voltage criteria for LVH, may be normal variant ( Sokolow-Lyon ) Nonspecific ST abnormality Abnormal ECG When compared with  ECG of 31-May-2009 03:50, PREVIOUS ECG IS PRESENT Confirmed by Pamella Sharper [id number]) on 06/03/2024 2:20:08 PM  Radiology: DG Chest Portable 1 View Result Date: 06/03/2024 CLINICAL DATA:  Chest pain EXAM: PORTABLE CHEST 1 VIEW COMPARISON:  None Available. FINDINGS: external cardiac pad noted. Normal mediastinum and cardiac silhouette. Normal pulmonary vasculature. No evidence of effusion, infiltrate, or pneumothorax. No acute bony abnormality. IMPRESSION: No acute cardiopulmonary process. Electronically Signed   By: Jackquline Boxer M.D.   On: 06/03/2024 15:16     Procedures   Medications Ordered in the ED  aspirin  EC tablet 81 mg (has no administration in time range)  nitroGLYCERIN  (NITROSTAT ) SL tablet 0.4 mg (has no administration in time range)  acetaminophen  (TYLENOL ) tablet 650 mg (has no administration in time range)  ondansetron  (ZOFRAN ) injection 4 mg (has no administration in time range)  amLODipine  (NORVASC ) tablet 10 mg (has no administration in time range)  rosuvastatin  (CRESTOR ) tablet 10 mg (has no administration in time range)  insulin  aspart (novoLOG ) injection 0-15 Units (has no administration in time range)  irbesartan  (AVAPRO ) tablet 300 mg (has no administration in time range)    And  hydrochlorothiazide  (HYDRODIURIL ) tablet 12.5 mg (has no administration in time range)    Clinical Course as of 06/03/24 1549  Thu Jun 03, 2024  1432 Cards consulted [AG]  1435 PMH HTN, HLD, TIIDM, CKD [AG]  1436 Discussed with cardiology who will evaluate patient in the ED [MP]  1437 EKG reviewed 3rd degree heart block with c/f acute T wave changes. No acute ischemic change noted [AG]  1442 K 3.4 iSTAT [AG]  1517 W- htn hld, ckd, t2dm Sob for months, htn today  Complete heart block.   F/u labs and likely cards to admit [RC]    Clinical Course User Index [AG] Nada Chroman, DO [MP] Pamella Sharper LABOR, DO [RC] Sharyne Darina RAMAN, MD                                 Medical  Decision Making Amount and/or Complexity of Data Reviewed Labs: ordered. Radiology: ordered.  Risk Decision regarding hospitalization.   On initial evaluation patient is bradycardic and hypertensive, saturating well on room air and not in acute distress.  Based upon bradycardia, EKG obtained with evidence of third-degree heart block.  Pads placed on patient.  However, patient is hypertensive and not hemodynamically unstable therefore no acute intervention necessary at this time.  With patient's history of CKD, concern for electrolyte abnormalities specifically hyperkalemia therefore will obtain i-STAT chemistries.  EKG without evidence of other acute ischemic change however will obtain troponin as well as BNP.  Cardiology consulted and pending recommendations at the time of signout.  Laboratory studies significant for hypokalemia as well as AKI.  Anticipated admission with need for cardiology evaluation.  At the time of signout, patient  remained hemodynamically stable with SBP's 130s and pads in place.     Final diagnoses:  Third degree heart block [facility])    ED Discharge Orders     None      Lavanda Bolster DO  Emergency Medicine PGY2    Danelia Snodgrass, DO 06/03/24 1550    Pamella Sharper A, DO 06/12/24 1654

## 2024-06-03 NOTE — Progress Notes (Signed)
   06/03/24 1811  Assess: MEWS Score  Temp 98.1 F (36.7 C)  BP (!) 188/50  MAP (mmHg) 86  Pulse Rate (!) 39  ECG Heart Rate (!) 37  Resp 18  Level of Consciousness Alert  SpO2 99 %  O2 Device Nasal Cannula  O2 Flow Rate (L/min) 1 L/min  Assess: MEWS Score  MEWS Temp 0  MEWS Systolic 0  MEWS Pulse 2  MEWS RR 0  MEWS LOC 0  MEWS Score 2  MEWS Score Color Yellow  Assess: if the MEWS score is Yellow or Red  Were vital signs accurate and taken at a resting state? Yes  Does the patient meet 2 or more of the SIRS criteria? No  MEWS guidelines implemented  Yes, yellow  Treat  MEWS Interventions Considered administering scheduled or prn medications/treatments as ordered  Take Vital Signs  Increase Vital Sign Frequency  Yellow: Q2hr x1, continue Q4hrs until patient remains green for 12hrs  Escalate  MEWS: Escalate Yellow: Discuss with charge nurse and consider notifying provider and/or RRT  Notify: Charge Nurse/RN  Name of Charge Nurse/RN Notified Jessica, RN  Provider Notification  Provider Name/Title Oneil Amend MD  Date Provider Notified 06/03/24  Time Provider Notified [phone]  Method of Notification  (Secure chat)  Notification Reason Other (Comment) (Elevated BP)  Provider response See new orders  Date of Provider Response 06/03/24  Time of Provider Response 1840  Assess: SIRS CRITERIA  SIRS Temperature  0  SIRS Respirations  0  SIRS Pulse 0  SIRS WBC 0  SIRS Score Sum  0

## 2024-06-03 NOTE — ED Notes (Signed)
 Floor notified of patient coming up

## 2024-06-03 NOTE — H&P (Addendum)
 Cardiology Admission History and Physical   Patient ID: [name] Holt MRN: [id number]; DOB: 06-22-1951   Admission date: 06/03/2024  PCP:  Clarice Nottingham, MD   [facility] Providers Cardiologist:  Darryle ONEIDA Decent, MD     Chief Complaint:  complete heart block  Patient Profile: [name] Holt is a 73 y.o. male with hypertension, hyperlipidemia, GERD, diabetes, mild AS, CKD, OSA who is being seen 06/03/2024 for the evaluation of complete heart block.  History of Present Illness: [name] Holt has past medical history as stated above. He presented with chief complaint of chest pressure and shortness of breath. EKG showed complete heart block with HR 30s. His home medications include bisoprolol 10 mg daily, which he states he has been taking for years without any issues.   He reports his symptoms of chest pressure, dizziness, shortness of breath started ~ 1 month ago. He tells me that his chest pressure is only ever as bad as 5-6/10 in terms of severity. He denies any episodes of syncope but does note increased dizziness lately. He presented with BP 208/54, HR 30-40s, SpO2 98-100% on room air.   His home medications include: amlodipine  10 mg daily, ASA 81 mg daily, bisoprolol 10 mg daily, Metformin 500 mg four times daily, olmesartan -hydrochlorothiazide  40-12.5 mg daily, Crestor  20 mg daily.  Relevant workup in the ED: creatinine 1.40, potassium 3.4, CBC stable, awaiting most labs.  Plans are to admit to observe heart rate off his beta-blocker and consider EP consultation if he remains in complete heart block to consider pacemaker placement.   Past Medical History:  Diagnosis Date   Allergic rhinitis    Chest pain    NON CARDIAC   CKD (chronic kidney disease)    Diabetes mellitus without complication (HCC)    GERD (gastroesophageal reflux disease)    Hyperlipidemia    Hypertension    Paresthesia    face   Sleep apnea    Past Surgical History:  Procedure Laterality  Date   CYSTECTOMY     LEFT WRIST    Medications Prior to Admission: Prior to Admission medications   Medication Sig Start Date End Date Taking? Authorizing Provider  amLODipine  (NORVASC ) 10 MG tablet TAKE 1 TABLET BY MOUTH EVERY DAY **MUST SCHEDULE APPT FOR FUTURE REFILLS** 06/11/21   O'Neal, Darryle Ned, MD  aspirin  81 MG EC tablet Take 81 mg by mouth daily. 09/14/19   [provider]  bisoprolol (ZEBETA) 10 MG tablet Take 10 mg by mouth daily. 08/06/22   [provider]  cetirizine (ZYRTEC) 10 MG tablet Take 10 mg by mouth daily. 05/19/12   [provider]  cyanocobalamin  100 MCG tablet as directed Orally    [provider]  famotidine (PEPCID) 20 MG tablet Take 20 mg by mouth at bedtime.    [provider]  metFORMIN (GLUCOPHAGE-XR) 500 MG 24 hr tablet Take 500 mg by mouth in the morning, at noon, in the evening, and at bedtime. 01/01/22   [provider]  olmesartan  (BENICAR ) 40 MG tablet Take 40 mg by mouth daily. Patient not taking: Reported on 05/13/2023    [provider]  olmesartan -hydrochlorothiazide  (BENICAR  HCT) 40-12.5 MG tablet 1 tablet Orally Once a day    [provider]  pantoprazole (PROTONIX) 40 MG tablet  12/13/19   [provider]  rosuvastatin  (CRESTOR ) 20 MG tablet Take 1 tablet (20 mg total) by mouth daily. Patient taking differently: Take 10 mg by mouth daily. 10/21/11   [place], Peter  M, MD    Allergies:   No Known Allergies  Social History:   Social History   Socioeconomic History   Marital status: Married    Spouse name: Not on file   Number of children: Not on file   Years of education: Not on file   Highest education level: Not on file  Occupational History   Not on file  Tobacco Use   Smoking status: Former    Current packs/day: 0.00    Types: Cigarettes    Start date: 41    Quit date: 2000    Years since quitting: 25.5   Smokeless tobacco: Never  Vaping Use   Vaping  status: Never Used  Substance and Sexual Activity   Alcohol use: Not Currently   Drug use: No   Sexual activity: Not on file  Other Topics Concern   Not on file  Social History Narrative   Lives with wife   Social Drivers of Corporate investment banker Strain: Not on file  Food Insecurity: Not on file  Transportation Needs: Not on file  Physical Activity: Not on file  Stress: Not on file  Social Connections: Not on file  Intimate Partner Violence: Not on file    Family History:   The patient's family history includes Diabetes in his mother; Heart attack in his brother and father; Heart disease in his brother, brother, father, and mother; Hypertension in his mother; Pneumonia in his brother; Stroke in his brother, brother, father, and mother.    ROS:  Please see the history of present illness.  All other ROS reviewed and negative.     Physical Exam/Data: Vitals:   06/03/24 1440 06/03/24 1445 06/03/24 1450 06/03/24 1455  BP: (!) 173/54 (!) 165/60 (!) 178/52 (!) 163/50  Pulse: (!) 37 (!) 40 (!) 40 (!) 37  Resp: 16 13 15 10   Temp:      SpO2: 98% 96% 97% 96%  Weight:      Height:       No intake or output data in the 24 hours ending 06/03/24 1520    06/03/2024    2:14 PM 05/13/2023    2:57 PM 09/10/2022    8:38 AM  Last 3 Weights  Weight (lbs) 220 lb 226 lb 226 lb  Weight (kg) 99.791 kg 102.513 kg 102.513 kg     Body mass index is 31.57 kg/m.   General:  in no acute distress HEENT: normal Neck: no JVD Vascular: No carotid bruits; Distal pulses 2+ bilaterally   Cardiac:  normal S1, S2; bradycardic; no murmur  Lungs:  clear to auscultation bilaterally, no wheezing, rhonchi or rales  Abd: soft, nontender, no hepatomegaly  Ext: no edema Musculoskeletal:  No deformities, BUE and BLE strength normal and equal Skin: warm and dry  Neuro:  no focal abnormalities noted Psych:  Normal affect   EKG:  The ECG that was done 06/03/2024 was personally reviewed and demonstrates  high grade AV block  Relevant CV Studies:  Echocardiogram, 06/03/2024 Ordered updated, pending review   Laboratory Data: High Sensitivity Troponin:  No results for input(s): TROPONINIHS in the last 720 hours.    Chemistry Recent Labs  Lab 06/03/24 1439  NA 142  K 3.4*  CL 103  GLUCOSE 109*  BUN 17  CREATININE 1.40*    No results for input(s): PROT, ALBUMIN, AST, ALT, ALKPHOS, BILITOT in the last 168 hours. Lipids No results for input(s): CHOL, TRIG, HDL, LABVLDL, LDLCALC, CHOLHDL in the last  168 hours. Hematology Recent Labs  Lab 06/03/24 1432 06/03/24 1439  WBC 4.9  --   RBC 4.35  --   HGB 12.9* 13.9  HCT 39.7 41.0  MCV 91.3  --   MCH 29.7  --   MCHC 32.5  --   RDW 12.0  --   PLT 227  --    Thyroid  No results for input(s): TSH, FREET4 in the last 168 hours. BNPNo results for input(s): BNP, PROBNP in the last 168 hours.  DDimer No results for input(s): DDIMER in the last 168 hours.  Radiology/Studies:  DG Chest Portable 1 View Result Date: 06/03/2024 CLINICAL DATA:  Chest pain EXAM: PORTABLE CHEST 1 VIEW COMPARISON:  None Available. FINDINGS: external cardiac pad noted. Normal mediastinum and cardiac silhouette. Normal pulmonary vasculature. No evidence of effusion, infiltrate, or pneumothorax. No acute bony abnormality. IMPRESSION: No acute cardiopulmonary process. Electronically Signed   By: Jackquline Boxer M.D.   On: 06/03/2024 15:16   Assessment and Plan:  Complete heart block Patient presented in high grade AV block on EKG  On bisoprolol at home, last dose AM 7/31  Admit to observe with beta-blocker washout  Will need EP consultation and possible PPM if remains in CHB post beta-blocker washout  Continue to monitor on telemetry  Hypokalemia  Initial potassium 3.4 Will supplement   Hypertension  BP at presentation 208/54 Most recent BP 163/50 Home meds: amlodipine  10 mg daily, olmesartan -hydrochlorothiazide  40-12.5 mg  daily Continue home meds  Holding beta blocker Titrate medications as needed   Hyperlipidemia  Home meds: Crestor  10 mg daily  Continue statin   Mild AS Per echo 05/2023 Repeating echocardiogram   Diabetes Home meds: Metformin 500 mg four times a day  Will be on SSI while inpatient   CKD Creatinine 1.40 today Last value from 15 years ago  Possibly new baseline  Continue to monitor   GERD Home med: Protonix 40 mg daily  Continue home meds  Risk Assessment/Risk Scores:   Code Status: Full Code  Severity of Illness: The appropriate patient status for this patient is INPATIENT. Inpatient status is judged to be reasonable and necessary in order to provide the required intensity of service to ensure the patient's safety. The patient's presenting symptoms, physical exam findings, and initial radiographic and laboratory data in the context of their chronic comorbidities is felt to place them at high risk for further clinical deterioration. Furthermore, it is not anticipated that the patient will be medically stable for discharge from the hospital within 2 midnights of admission.   * I certify that at the point of admission it is my clinical judgment that the patient will require inpatient hospital care spanning beyond 2 midnights from the point of admission due to high intensity of service, high risk for further deterioration and high frequency of surveillance required.*  For questions or updates, please contact [facility] Please consult [email or web address] for contact info under     Signed, [name] DELENA Donath, PA-C  06/03/2024 3:20 PM   Personally seen and examined. Agree with above.    Complete heart block with junctional escape Alert and oriented x 3 in no acute distress, conversant laying comfortably in bed with no syncope -Tomorrow consult EP. -Discussed potential pacemaker needs -Discussed the possibility of temporary wire if clinical condition  deteriorates.  Took bisoprolol 10 mg this morning.  Mild aortic stenosis - Repeating echocardiogram, soft systolic murmur on exam  Diabetes with CKD stage IIIa - Creatinine 1.4 -Hold metformin  Hypertension - Mildly elevated here in the ER.  Okay to utilize olmesartan  hydrochlorothiazide  and amlodipine   Spoke with he and his wife [name] Holt.  Retired.  His birthday is tomorrow.  He will be 73.  [name] Parchment, MD

## 2024-06-04 ENCOUNTER — Inpatient Hospital Stay (HOSPITAL_COMMUNITY)

## 2024-06-04 DIAGNOSIS — I442 Atrioventricular block, complete: Secondary | ICD-10-CM

## 2024-06-04 DIAGNOSIS — I10 Essential (primary) hypertension: Secondary | ICD-10-CM

## 2024-06-04 DIAGNOSIS — I441 Atrioventricular block, second degree: Secondary | ICD-10-CM

## 2024-06-04 LAB — CBC
HCT: 37.2 % — ABNORMAL LOW (ref 39.0–52.0)
Hemoglobin: 12.4 g/dL — ABNORMAL LOW (ref 13.0–17.0)
MCH: 29.8 pg (ref 26.0–34.0)
MCHC: 33.3 g/dL (ref 30.0–36.0)
MCV: 89.4 fL (ref 80.0–100.0)
Platelets: 223 K/uL (ref 150–400)
RBC: 4.16 MIL/uL — ABNORMAL LOW (ref 4.22–5.81)
RDW: 11.9 % (ref 11.5–15.5)
WBC: 5.1 K/uL (ref 4.0–10.5)
nRBC: 0 % (ref 0.0–0.2)

## 2024-06-04 LAB — ECHOCARDIOGRAM COMPLETE
AR max vel: 3.12 cm2
AV Area VTI: 3.16 cm2
AV Area mean vel: 2.96 cm2
AV Mean grad: 16 mmHg
AV Peak grad: 31.1 mmHg
Ao pk vel: 2.79 m/s
Area-P 1/2: 2.39 cm2
Calc EF: 78.5 %
Height: 70 in
S' Lateral: 2.7 cm
Single Plane A2C EF: 79.6 %
Single Plane A4C EF: 78.9 %
Weight: 3527.36 [oz_av]

## 2024-06-04 LAB — BASIC METABOLIC PANEL WITH GFR
Anion gap: 11 (ref 5–15)
BUN: 12 mg/dL (ref 8–23)
CO2: 23 mmol/L (ref 22–32)
Calcium: 9.6 mg/dL (ref 8.9–10.3)
Chloride: 105 mmol/L (ref 98–111)
Creatinine, Ser: 1.32 mg/dL — ABNORMAL HIGH (ref 0.61–1.24)
GFR, Estimated: 58 mL/min — ABNORMAL LOW (ref >60)
Glucose, Bld: 107 mg/dL — ABNORMAL HIGH (ref 70–99)
Potassium: 3.1 mmol/L — ABNORMAL LOW (ref 3.5–5.1)
Sodium: 139 mmol/L (ref 135–145)

## 2024-06-04 LAB — GLUCOSE, CAPILLARY
Glucose-Capillary: 122 mg/dL — ABNORMAL HIGH (ref 70–99)
Glucose-Capillary: 120 mg/dL — ABNORMAL HIGH (ref 70–99)
Glucose-Capillary: 180 mg/dL — ABNORMAL HIGH (ref 70–99)
Glucose-Capillary: 95 mg/dL (ref 70–99)

## 2024-06-04 LAB — LIPID PANEL
Cholesterol: 102 mg/dL (ref 0–200)
HDL: 50 mg/dL (ref >40)
LDL Cholesterol: 39 mg/dL (ref 0–99)
Total CHOL/HDL Ratio: 2 ratio
Triglycerides: 65 mg/dL (ref <150)
VLDL: 13 mg/dL (ref 0–40)

## 2024-06-04 LAB — HEMOGLOBIN A1C
Hgb A1c MFr Bld: 5.8 % — ABNORMAL HIGH (ref 4.8–5.6)
Mean Plasma Glucose: 120 mg/dL

## 2024-06-04 MED ORDER — POTASSIUM CHLORIDE CRYS ER 20 MEQ PO TBCR
40.0000 meq | EXTENDED_RELEASE_TABLET | Freq: Two times a day (BID) | ORAL | Status: AC
  Administered 2024-06-04 (×2): 40 meq via ORAL
  Filled 2024-06-04 (×2): qty 2

## 2024-06-04 MED ORDER — POTASSIUM CHLORIDE CRYS ER 20 MEQ PO TBCR: 40.0000 meq | EXTENDED_RELEASE_TABLET | Freq: Two times a day (BID) | ORAL | Status: DC

## 2024-06-04 NOTE — TOC Initial Note (Addendum)
 Transition of Care [facility]) - Initial/Assessment Note    Patient Details  Name: [name] Holt MRN: [id number] Date of Birth: [birth year]  Transition of Care [facility]) CM/SW Contact:    Sudie Erminio Deems, RN Phone Number: 06/04/2024, 2:17 PM  Clinical Narrative:  Patient presented for complete heart block. EP to consult. PTA patient was independent from home with spouse. Patient has insurance and PCP-has transportation to get to appointments. Patient has DME cane and CPAP in the home. Case Manager will continue to follow for disposition needs.  Expected Discharge Plan: Home/Self Care Barriers to Discharge: No Barriers Identified   Patient Goals and CMS Choice Patient states their goals for this hospitalization and ongoing recovery are:: plan to return home once stable   Choice offered to / list presented to : NA      Expected Discharge Plan and Services In-house Referral: NA Discharge Planning Services: CM Consult Post Acute Care Choice: NA Living arrangements for the past 2 months: Single Family Home                   DME Agency: NA   Prior Living Arrangements/Services Living arrangements for the past 2 months: Single Family Home   Patient language and need for interpreter reviewed:: Yes Do you feel safe going back to the place where you live?: Yes      Need for Family Participation in Patient Care: No (Comment) Care giver support system in place?: No (comment)   Criminal Activity/Legal Involvement Pertinent to Current Situation/Hospitalization: No - Comment as needed  Activities of Daily Living   ADL Screening (condition at time of admission) Independently performs ADLs?: Yes (appropriate for developmental age) Is the patient deaf or have difficulty hearing?: No Does the patient have difficulty seeing, even when wearing glasses/contacts?: No Does the patient have difficulty concentrating, remembering, or making decisions?: No  Permission  Sought/Granted Permission sought to share information with : Family Supports, Case Manager   Emotional Assessment Appearance:: Appears stated age Attitude/Demeanor/Rapport: Engaged Affect (typically observed): Appropriate Orientation: : Oriented to Self, Oriented to Place, Oriented to Situation Alcohol / Substance Use: Not Applicable Psych Involvement: No (comment)  Admission diagnosis:  Complete heart block (HCC) [I44.2] Third degree heart block (HCC) [I44.2] Patient Active Problem List   Diagnosis Date Noted   Complete heart block (HCC) 06/03/2024   Essential hypertension, benign 03/16/2012   Other and unspecified hyperlipidemia 03/16/2012   Non-cardiac chest pain 03/16/2012   PCP:  Clarice Nottingham, MD Pharmacy:   [id number] - [place], [place] - [address] AT [facility] OF [address] [place] [address] Phone: [phone] Fax: [phone]  [id number] [place], [place] - [address] AT [facility] OF [address] & [facility] [address] [place] [address] Phone: [phone] Fax: [phone]     Social Drivers of Health (SDOH) Social History: SDOH Screenings   Food Insecurity: No Food Insecurity (06/03/2024)  Housing: Low Risk  (06/03/2024)  Transportation Needs: No Transportation Needs (06/03/2024)  Utilities: Not At Risk (06/03/2024)  Social Connections: Moderately Integrated (06/03/2024)  Tobacco Use: Medium Risk (06/03/2024)   SDOH Interventions:     Readmission Risk Interventions     No data to display

## 2024-06-04 NOTE — Plan of Care (Signed)

## 2024-06-04 NOTE — Progress Notes (Addendum)
 Rounding Note   Patient Name: [name] Holt Date of Encounter: 06/04/2024  [facility] Cardiologist: Darryle ONEIDA Decent, MD   Subjective  Pt feels well laying in bed, ambulated to bathroom OK but hasn't been in halls.  Scheduled Meds:  amLODipine   10 mg Oral Daily   aspirin  EC  81 mg Oral Daily   irbesartan   300 mg Oral Daily   And   hydrochlorothiazide   12.5 mg Oral Daily   insulin  aspart  0-15 Units Subcutaneous TID WC   potassium chloride   40 mEq Oral BID   rosuvastatin   10 mg Oral Daily   Continuous Infusions:  PRN Meds: acetaminophen , hydrALAZINE , nitroGLYCERIN , ondansetron  (ZOFRAN ) IV   Vital Signs  Vitals:   06/03/24 2330 06/04/24 0444 06/04/24 0833 06/04/24 1004  BP: (!) 165/87 (!) 179/57 (!) 160/48 (!) 178/79  Pulse: (!) 38 (!) 36 (!) 37 (!) 37  Resp: 18 18 20 20   Temp: 98.2 F (36.8 C) 98.4 F (36.9 C) 98.2 F (36.8 C) 98.1 F (36.7 C)  TempSrc: Oral Oral Oral Oral  SpO2: 95%  96% 98%  Weight:      Height:        Intake/Output Summary (Last 24 hours) at 06/04/2024 1052 Last data filed at 06/04/2024 0445 Gross per 24 hour  Intake --  Output 1100 ml  Net -1100 ml      06/03/2024    6:09 PM 06/03/2024    2:14 PM 05/13/2023    2:57 PM  Last 3 Weights  Weight (lbs) 220 lb 7.4 oz 220 lb 226 lb  Weight (kg) 100 kg 99.791 kg 102.513 kg      Telemetry  CHB and Second degree type 2 AV block HR in the 30s - Personally Reviewed  ECG   Complete heart block HR 37 - Personally Reviewed  Physical Exam  GEN: No acute distress.   Neck: No JVD Cardiac: regular rhythm, bradycardic rate, 3/6 systolic murmur  Respiratory: Clear to auscultation bilaterally. GI: Soft, nontender, non-distended  MS: No edema; No deformity. Neuro:  Nonfocal  Psych: Normal affect   Labs High Sensitivity Troponin:   Recent Labs  Lab 06/03/24 1432 06/03/24 1653  TROPONINIHS 17 17     Chemistry Recent Labs  Lab 06/03/24 1432 06/03/24 1439 06/04/24 0443  NA  139 142 139  K 3.7 3.4* 3.1*  CL 105 103 105  CO2 26  --  23  GLUCOSE 109* 109* 107*  BUN 14 17 12   CREATININE 1.31* 1.40* 1.32*  CALCIUM  9.8  --  9.6  GFRNONAA 59*  --  58*  ANIONGAP 8  --  11    Lipids  Recent Labs  Lab 06/04/24 0443  CHOL 102  TRIG 65  HDL 50  LDLCALC 39  CHOLHDL 2.0    Hematology Recent Labs  Lab 06/03/24 1432 06/03/24 1439 06/04/24 0443  WBC 4.9  --  5.1  RBC 4.35  --  4.16*  HGB 12.9* 13.9 12.4*  HCT 39.7 41.0 37.2*  MCV 91.3  --  89.4  MCH 29.7  --  29.8  MCHC 32.5  --  33.3  RDW 12.0  --  11.9  PLT 227  --  223   Thyroid   Recent Labs  Lab 06/03/24 1653  TSH 2.639    BNP Recent Labs  Lab 06/03/24 1432  BNP 127.9*    DDimer No results for input(s): DDIMER in the last 168 hours.   Radiology  DG Chest Portable 1  View Result Date: 06/03/2024 CLINICAL DATA:  Chest pain EXAM: PORTABLE CHEST 1 VIEW COMPARISON:  None Available. FINDINGS: external cardiac pad noted. Normal mediastinum and cardiac silhouette. Normal pulmonary vasculature. No evidence of effusion, infiltrate, or pneumothorax. No acute bony abnormality. IMPRESSION: No acute cardiopulmonary process. Electronically Signed   By: Jackquline Boxer M.D.   On: 06/03/2024 15:16    Cardiac Studies  Echo pending today  Patient Profile   73 y.o. male with hypertension, hyperlipidemia, GERD, diabetes, mild AS, CKD, OSA who is being seen 06/03/2024 for the evaluation of complete heart block.   Took BB yesterday 06/03/24 in the AM.  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 - admitted for heart block and beta blocker washout - telemetry today with 2:1 AV block, HR in the 30s - will consult EP   Hypokalemia - K 3.1 - will supplement, sCr 1.32 - near baseline   Chest pain - he states he has had exertional chest pain and chest pain after eating for about 9 months - he push mows his yard and notices the CP with mowing - difficult to determine  if this is angina, hypertensive urgency, or due to conduction disease - if echo is abnormal or valuvlar disease has progressed, may ultimately need a heart catheterization - if echo largely normal, can consider CT coronary  -timing if he needs PPM will need to be considered   Hypertension - bisoprolol held  - continue 10 mg amlodipine , olmesartan -hydrochlorothiazide  - hypertensive this morning - will    Mild aortic stenosis - echo pending today   GERD - continue protonix   DM - SSI - holding metformin       For questions or updates, please contact [facility] Please consult [email or web address] for contact info under     Signed, Jon Nat Hails, PA  06/04/2024, 10:52 AM    Personally seen and examined. Agree with above.  I think it still makes sense for him to be n.p.o. just in case we can have pacemaker later today.  EP consultation in process.  I have touch base with Elvie.  Echocardiogram preliminarily reviewed, normal ejection fraction, moderate aortic valve regurgitation.  No aortic valve stenosis.  Off bisoprolol.  Oneil Parchment, MD  ADDEN: AKI has been ruled out  Oneil Parchment, MD

## 2024-06-04 NOTE — Plan of Care (Signed)

## 2024-06-04 NOTE — Consult Note (Addendum)
 ELECTROPHYSIOLOGY CONSULT NOTE    Patient ID: [name] Holt MRN: [id number], DOB/AGE: 02-14-1951 73 y.o.  Admit date: 06/03/2024 Date of Consult: 06/04/2024  Primary Physician: Clarice Nottingham, MD Primary Cardiologist: Darryle ONEIDA Decent, MD  Electrophysiologist: none   Referring Provider: @ATTENDING @  Patient Profile: [name] Holt is a 73 y.o. male with a history of HTN, HLD, Gerd, T2DM, CKD, OSA who is being seen today for the evaluation of 2:1 AV block at the request of Dr. Jeffrie.  HPI:  [name] Holt is a 74 y.o. male with PMH as above.  He presented to ER with chest pressure, SOB, dizziness that started about 1 month ago. No syncope. Initial EKG with HR in 30s w CHB. He was on bisoprolol at that time, which has been hold.   Currently, he is feeling well without complaints. He has ambulated around room some.   Labs Potassium3.1* (08/01 0443)   Creatinine, ser  1.32* (08/01 0443) PLT  223 (08/01 0443) HGB  12.4* (08/01 0443) WBC 5.1 (08/01 0443) Troponin I (High Sensitivity)17 (07/31 1653).    Past Medical History:  Diagnosis Date   Allergic rhinitis    Chest pain    NON CARDIAC   CKD (chronic kidney disease)    Diabetes mellitus without complication (HCC)    GERD (gastroesophageal reflux disease)    Hyperlipidemia    Hypertension    Paresthesia    face   Sleep apnea      Surgical History:  Past Surgical History:  Procedure Laterality Date   CYSTECTOMY     LEFT WRIST     Medications Prior to Admission  Medication Sig Dispense Refill Last Dose/Taking   amLODipine  (NORVASC ) 10 MG tablet TAKE 1 TABLET BY MOUTH EVERY DAY **MUST SCHEDULE APPT FOR FUTURE REFILLS** 15 tablet 0 06/02/2024   aspirin  81 MG EC tablet Take 81 mg by mouth daily.   06/02/2024   bisoprolol (ZEBETA) 10 MG tablet Take 10 mg by mouth daily.   06/02/2024   cetirizine (ZYRTEC) 10 MG tablet Take 10 mg by mouth daily as needed for allergies.   Unknown   cyanocobalamin  100 MCG tablet  Take 100 mcg by mouth daily.   06/02/2024   famotidine (PEPCID) 20 MG tablet Take 20 mg by mouth at bedtime.   06/02/2024   metFORMIN (GLUCOPHAGE-XR) 500 MG 24 hr tablet Take 500 mg by mouth in the morning and at bedtime.   06/03/2024 Morning   olmesartan -hydrochlorothiazide  (BENICAR  HCT) 40-12.5 MG tablet 1 tablet Orally Once a day   06/02/2024   pantoprazole (PROTONIX) 40 MG tablet Take 40 mg by mouth daily.   06/03/2024   rosuvastatin  (CRESTOR ) 20 MG tablet Take 20 mg by mouth every evening.   06/02/2024   tamsulosin (FLOMAX) 0.4 MG CAPS capsule Take 0.4 mg by mouth daily.   06/02/2024    Inpatient Medications:   amLODipine   10 mg Oral Daily   aspirin  EC  81 mg Oral Daily   irbesartan   300 mg Oral Daily   And   hydrochlorothiazide   12.5 mg Oral Daily   insulin  aspart  0-15 Units Subcutaneous TID WC   potassium chloride  40 mEq Oral BID   rosuvastatin   10 mg Oral Daily    Allergies: No Known Allergies  Family History  Problem Relation Age of Onset   Hypertension Mother    Stroke Mother    Diabetes Mother    Heart disease Mother    Heart disease Father    Stroke  Father    Heart attack Father    Stroke Brother    Heart disease Brother    Pneumonia Brother    Stroke Brother    Heart disease Brother    Heart attack Brother      Physical Exam: Vitals:   06/04/24 0833 06/04/24 1004 06/04/24 1221 06/04/24 1423  BP: (!) 160/48 (!) 178/79 (!) 146/61 (!) 154/52  Pulse: (!) 37 (!) 37 (!) 37 (!) 38  Resp: 20 20 20 20   Temp: 98.2 F (36.8 C) 98.1 F (36.7 C) 98.2 F (36.8 C) 98.3 F (36.8 C)  TempSrc: Oral Oral Oral Oral  SpO2: 96% 98% 97% 98%  Weight:      Height:        GEN- NAD, A&O x 3, normal affect HEENT: Normocephalic, atraumatic Lungs- CTAB, Normal effort.  Heart- irregular rate and rhythm, No M/G/R.  GI- Soft, NT, ND.  Extremities- No clubbing, cyanosis, or edema   Radiology/Studies: ECHOCARDIOGRAM COMPLETE Result Date: 06/04/2024    ECHOCARDIOGRAM REPORT    Patient Name:   [name] Holt Date of Exam: 06/04/2024 Medical Rec #:  [id number]         Height:       70.0 in Accession #:    [id number]        Weight:       220.5 lb Date of Birth:  Jun 08, 1951          BSA:          2.176 m Patient Age:    73 years          BP:           178/79 mmHg Patient Gender: M                 HR:           36 bpm. Exam Location:  Inpatient Procedure: 2D Echo, Cardiac Doppler and Color Doppler (Both Spectral and Color            Flow Doppler were utilized during procedure). Indications:    ; I44.2 Complete heart block  History:        Patient has prior history of Echocardiogram examinations, most                 recent 05/09/2023. CAD, Signs/Symptoms:Chest Pain,                 Dizziness/Lightheadedness, Shortness of Breath and Dyspnea; Risk                 Factors:Sleep Apnea, Former Smoker, Hypertension, Diabetes and                 Dyslipidemia.  Sonographer:    Ellouise Mose RDCS Referring Phys: [id number] TAYLOR A PARCELLS  Sonographer Comments: Technically difficult study due to poor echo windows. IMPRESSIONS  1. Left ventricular ejection fraction, by estimation, is 70 to 75%. The left ventricle has hyperdynamic function. The left ventricle has no regional wall motion abnormalities. There is moderate asymmetric left ventricular hypertrophy of the basal-septal  segment. Left ventricular diastolic parameters were normal.  2. Right ventricular systolic function is normal. The right ventricular size is mildly enlarged. Tricuspid regurgitation signal is inadequate for assessing PA pressure.  3. The mitral valve is normal in structure. Trivial mitral valve regurgitation. No evidence of mitral stenosis.  4. The aortic valve is tricuspid. There is mild calcification of the aortic valve. Aortic valve regurgitation is moderate. Mild aortic valve stenosis.  5. Aortic dilatation noted. There is dilatation of the ascending aorta, measuring 43 mm.  6. The inferior vena cava is normal in size with greater  than 50% respiratory variability, suggesting right atrial pressure of 3 mmHg. FINDINGS  Left Ventricle: Left ventricular ejection fraction, by estimation, is 70 to 75%. The left ventricle has hyperdynamic function. The left ventricle has no regional wall motion abnormalities. The left ventricular internal cavity size was normal in size. There is moderate asymmetric left ventricular hypertrophy of the basal-septal segment. Left ventricular diastolic parameters were normal. Right Ventricle: The right ventricular size is mildly enlarged. No increase in right ventricular wall thickness. Right ventricular systolic function is normal. Tricuspid regurgitation signal is inadequate for assessing PA pressure. Left Atrium: Left atrial size was normal in size. Right Atrium: Right atrial size was normal in size. Pericardium: There is no evidence of pericardial effusion. Mitral Valve: The mitral valve is normal in structure. Trivial mitral valve regurgitation. No evidence of mitral valve stenosis. Tricuspid Valve: The tricuspid valve is normal in structure. Tricuspid valve regurgitation is trivial. No evidence of tricuspid stenosis. Aortic Valve: The aortic valve is tricuspid. There is mild calcification of the aortic valve. Aortic valve regurgitation is moderate. Mild aortic stenosis is present. Aortic valve mean gradient measures 16.0 mmHg. Aortic valve peak gradient measures 31.1  mmHg. Aortic valve area, by VTI measures 3.16 cm. Pulmonic Valve: The pulmonic valve was normal in structure. Pulmonic valve regurgitation is trivial. No evidence of pulmonic stenosis. Aorta: Aortic dilatation noted. There is dilatation of the ascending aorta, measuring 43 mm. Venous: The inferior vena cava is normal in size with greater than 50% respiratory variability, suggesting right atrial pressure of 3 mmHg. IAS/Shunts: No atrial level shunt detected by color flow Doppler.  LEFT VENTRICLE PLAX 2D LVIDd:         4.89 cm      Diastology LVIDs:          2.70 cm      LV e' medial:    15.90 cm/s LV PW:         0.94 cm      LV E/e' medial:  4.4 LV IVS:        1.25 cm      LV e' lateral:   10.00 cm/s LVOT diam:     2.54 cm      LV E/e' lateral: 6.9 LV SV:         208 LV SV Index:   96 LVOT Area:     5.07 cm  LV Volumes (MOD) LV vol d, MOD A2C: 161.0 ml LV vol d, MOD A4C: 154.0 ml LV vol s, MOD A2C: 32.9 ml LV vol s, MOD A4C: 32.5 ml LV SV MOD A2C:     128.1 ml LV SV MOD A4C:     154.0 ml LV SV MOD BP:      127.0 ml RIGHT VENTRICLE             IVC RV S prime:     16.30 cm/s  IVC diam: 1.58 cm TAPSE (M-mode): 2.8 cm LEFT ATRIUM             Index        RIGHT ATRIUM           Index LA diam:        4.29 cm 1.97 cm/m   RA Area:     15.00 cm LA Vol (A2C):   36.0 ml 16.55 ml/m  RA Volume:   41.90 ml  19.26 ml/m LA Vol (A4C):   48.7 ml 22.39 ml/m LA Biplane Vol: 42.7 ml 19.63 ml/m  AORTIC VALVE AV Area (Vmax):    3.12 cm AV Area (Vmean):   2.96 cm AV Area (VTI):     3.16 cm AV Vmax:           279.00 cm/s AV Vmean:          182.000 cm/s AV VTI:            0.658 m AV Peak Grad:      31.1 mmHg AV Mean Grad:      16.0 mmHg LVOT Vmax:         172.00 cm/s LVOT Vmean:        106.250 cm/s LVOT VTI:          0.410 m LVOT/AV VTI ratio: 0.62  AORTA Ao Root diam: 3.84 cm Ao Asc diam:  4.24 cm MITRAL VALVE MV Area (PHT): 2.39 cm     SHUNTS MV Decel Time: 317 msec     Systemic VTI:  0.41 m MV E velocity: 69.20 cm/s   Systemic Diam: 2.54 cm MV A velocity: 115.00 cm/s MV E/A ratio:  0.60 Lonni Nanas MD Electronically signed by Lonni Nanas MD Signature Date/Time: 06/04/2024/1:25:16 PM    Final    DG Chest Portable 1 View Result Date: 06/03/2024 CLINICAL DATA:  Chest pain EXAM: PORTABLE CHEST 1 VIEW COMPARISON:  None Available. FINDINGS: external cardiac pad noted. Normal mediastinum and cardiac silhouette. Normal pulmonary vasculature. No evidence of effusion, infiltrate, or pneumothorax. No acute bony abnormality. IMPRESSION: No acute cardiopulmonary  process. Electronically Signed   By: Jackquline Boxer M.D.   On: 06/03/2024 15:16    EKG:06/04/2024 - CHB, rate 37(personally reviewed)  TELEMETRY: SR with wenkebach, periods of 2:1 HB, rates 50-60s (personally reviewed)  DEVICE HISTORY: none  Assessment/Plan: #) 2:1 HB #) wenkebach #) fatigue  Patient presented to hospital with CHB, feeling chest pressure, fatigue, dizziness. He was on bisoprolol at that time It has been held, but he continues to have 2:1 HB with narrow escape Avoid AVN blocking agents TSH normal No recent tick bites EP Aneshia Jacquet continue to follow, if conduction does not improve Dannette Kinkaid need PPM   MD to see     For questions or updates, please contact [facility] Please consult [email or web address] for contact info under Cardiology/STEMI.  Signed, Suzann Riddle, NP  06/04/2024 4:02 PM  I have seen and examined this patient with Elvie Needle.  Agree with above, note added to reflect my findings.  Patient with a past history as above.  He presented to the hospital with complete heart block.  He was previously on bisoprolol.  Bisoprolol has stopped, though he continues to be in 2-1 AV block.  He would likely require pacemaker implant.  He is feeling well lying in bed.  He was previously having chest pain, shortness of breath, dizziness when he presented to the emergency room.  GEN: No acute distress.   Neck: No JVD Cardiac: Bradycardic   Respiratory: Normal work of breathing GI: Soft, nontender, non-distended  MS: No edema; No deformity. Neuro:  Nonfocal  Skin: warm and dry Psych: Normal affect    Second-degree AV block: Patient Natallie Ravenscroft likely require pacemaker implant.  He was previously on bisoprolol but this has been stopped though he continues to have heart block.  Would continue to monitor on telemetry.  Would likely plan for pacemaker implant  next week.  Magaby Rumberger M. Juanette Urizar MD 06/04/2024 5:53 PM

## 2024-06-04 NOTE — Progress Notes (Signed)
  Echocardiogram 2D Echocardiogram has been performed.  Devora Ellouise SAUNDERS 06/04/2024, 11:40 AM

## 2024-06-05 DIAGNOSIS — I350 Nonrheumatic aortic (valve) stenosis: Secondary | ICD-10-CM

## 2024-06-05 LAB — BASIC METABOLIC PANEL WITH GFR
Anion gap: 10 (ref 5–15)
BUN: 14 mg/dL (ref 8–23)
CO2: 21 mmol/L — ABNORMAL LOW (ref 22–32)
Calcium: 9.5 mg/dL (ref 8.9–10.3)
Chloride: 107 mmol/L (ref 98–111)
Creatinine, Ser: 1.2 mg/dL (ref 0.61–1.24)
GFR, Estimated: >60 mL/min (ref >60)
Glucose, Bld: 102 mg/dL — ABNORMAL HIGH (ref 70–99)
Potassium: 3.8 mmol/L (ref 3.5–5.1)
Sodium: 138 mmol/L (ref 135–145)

## 2024-06-05 LAB — CBC
HCT: 38.5 % — ABNORMAL LOW (ref 39.0–52.0)
Hemoglobin: 12.6 g/dL — ABNORMAL LOW (ref 13.0–17.0)
MCH: 29.3 pg (ref 26.0–34.0)
MCHC: 32.7 g/dL (ref 30.0–36.0)
MCV: 89.5 fL (ref 80.0–100.0)
Platelets: 225 K/uL (ref 150–400)
RBC: 4.3 MIL/uL (ref 4.22–5.81)
RDW: 12 % (ref 11.5–15.5)
WBC: 5.8 K/uL (ref 4.0–10.5)
nRBC: 0 % (ref 0.0–0.2)

## 2024-06-05 LAB — GLUCOSE, CAPILLARY
Glucose-Capillary: 99 mg/dL (ref 70–99)
Glucose-Capillary: 111 mg/dL — ABNORMAL HIGH (ref 70–99)
Glucose-Capillary: 118 mg/dL — ABNORMAL HIGH (ref 70–99)
Glucose-Capillary: 83 mg/dL (ref 70–99)
Glucose-Capillary: 108 mg/dL — ABNORMAL HIGH (ref 70–99)

## 2024-06-05 LAB — LIPOPROTEIN A (LPA): Lipoprotein (a): 148.8 nmol/L — ABNORMAL HIGH (ref <75.0)

## 2024-06-05 NOTE — Progress Notes (Signed)
 Progress Note  Patient Name: [name] Holt Date of Encounter: 06/05/2024  Primary Cardiologist: Darryle ONEIDA Decent, MD  Subjective   No symptoms except for fatigue when he moves from his bed to the chair.  Inpatient Medications    Scheduled Meds:  amLODipine   10 mg Oral Daily   aspirin  EC  81 mg Oral Daily   irbesartan   300 mg Oral Daily   And   hydrochlorothiazide   12.5 mg Oral Daily   insulin  aspart  0-15 Units Subcutaneous TID WC   rosuvastatin   10 mg Oral Daily   Continuous Infusions:  PRN Meds: acetaminophen , hydrALAZINE , nitroGLYCERIN , ondansetron  (ZOFRAN ) IV   Vital Signs    Vitals:   06/05/24 0348 06/05/24 0400 06/05/24 0500 06/05/24 0736  BP: (!) 151/59   (!) 133/58  Pulse: (!) 40 (!) 37 (!) 37 (!) 38  Resp: 16   20  Temp: 98.2 F (36.8 C)   98.3 F (36.8 C)  TempSrc: Oral   Oral  SpO2: 99% 93% 98% 99%  Weight:      Height:        Intake/Output Summary (Last 24 hours) at 06/05/2024 0847 Last data filed at 06/05/2024 0415 Gross per 24 hour  Intake --  Output 300 ml  Net -300 ml   Filed Weights   06/03/24 1414 06/03/24 1809  Weight: 99.8 kg 100 kg    Telemetry     Personally reviewed. CHB consistently with intermittent ventricular escape rhythm  ECG    Not performed today.  Physical Exam   GEN: No acute distress.   Neck: No JVD. Cardiac: RRR, no murmur, rub, or gallop.  Respiratory: Nonlabored. Clear to auscultation bilaterally. GI: Soft, nontender, bowel sounds present. MS: No edema; No deformity. Neuro:  Nonfocal. Psych: Alert and oriented x 3. Normal affect.  Labs    Chemistry Recent Labs  Lab 06/03/24 1432 06/03/24 1439 06/04/24 0443 06/05/24 0443  NA 139 142 139 138  K 3.7 3.4* 3.1* 3.8  CL 105 103 105 107  CO2 26  --  23 21*  GLUCOSE 109* 109* 107* 102*  BUN 14 17 12 14   CREATININE 1.31* 1.40* 1.32* 1.20  CALCIUM  9.8  --  9.6 9.5  GFRNONAA 59*  --  58* >60  ANIONGAP 8  --  11 10     Hematology Recent Labs   Lab 06/03/24 1432 06/03/24 1439 06/04/24 0443 06/05/24 0443  WBC 4.9  --  5.1 5.8  RBC 4.35  --  4.16* 4.30  HGB 12.9* 13.9 12.4* 12.6*  HCT 39.7 41.0 37.2* 38.5*  MCV 91.3  --  89.4 89.5  MCH 29.7  --  29.8 29.3  MCHC 32.5  --  33.3 32.7  RDW 12.0  --  11.9 12.0  PLT 227  --  223 225    Cardiac Enzymes Recent Labs  Lab 06/03/24 1432 06/03/24 1653  TROPONINIHS 17 17    BNP Recent Labs  Lab 06/03/24 1432  BNP 127.9*     DDimerNo results for input(s): DDIMER in the last 168 hours.    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 - Patient complaining of fatigue but no symptoms of lightheadedness, dizziness and syncope. - Telemetry reviewed,  he has CHB consistently but has intermittent ventricular escape rhythm - Pacing pads intact on his chest - Avoid AV nodal blockers - Please call cardiology on-call if any concerns/questions  HTN, controlled - Continue amlodipine  10 mg once daily, HCTZ 12.5 mg once daily and irbesartan  300 mg once daily.  Mild aortic valve stenosis - Outpatient surveillance.   Signed, Diannah SHAUNNA Maywood, MD  06/05/2024, 8:47 AM

## 2024-06-05 NOTE — Progress Notes (Signed)
  Progress Note  Patient Name: [name] Holt Date of Encounter: 06/05/2024 [facility] Cardiologist: Darryle ONEIDA Decent, MD   Interval Summary   No acute overnight events. Patient reports feeling relatively well. No new or acute complaints.   Vital Signs Vitals:   06/05/24 0348 06/05/24 0400 06/05/24 0500 06/05/24 0736  BP: (!) 151/59   (!) 133/58  Pulse: (!) 40 (!) 37 (!) 37 (!) 38  Resp: 16   20  Temp: 98.2 F (36.8 C)   98.3 F (36.8 C)  TempSrc: Oral   Oral  SpO2: 99% 93% 98% 99%  Weight:      Height:        Intake/Output Summary (Last 24 hours) at 06/05/2024 1005 Last data filed at 06/05/2024 0415 Gross per 24 hour  Intake --  Output 300 ml  Net -300 ml      06/03/2024    6:09 PM 06/03/2024    2:14 PM 05/13/2023    2:57 PM  Last 3 Weights  Weight (lbs) 220 lb 7.4 oz 220 lb 226 lb  Weight (kg) 100 kg 99.791 kg 102.513 kg      Telemetry/ECG  SR with intermittent CHB, 2:1 and Mobitz I - Personally Reviewed  Physical Exam  GEN: No acute distress.   Neck: No JVD Cardiac: Bradycardic, irregular Respiratory: Clear to auscultation bilaterally. GI: Soft, nontender, non-distended  MS: No edema  Assessment  presented to the hospital with complete heart block. He was previously on bisoprolol. Bisoprolol was stopped yet he continues to have intermittent CHB. Narrow escape rhythm, normotensive.   Complete heart block 2nd degree AV block following bisoprolol washout Symptomatic bradycardia  Plan:  -Patient meets criteria for pacemaker implant for symptomatic bradycardia and second degree AV block.  -Tentatively plan for implant on Monday.  -Continue holding AVN blocking agents.  For questions or updates, please contact [facility] Please consult [email or web address] for contact info under       Signed, Fonda Kitty, MD

## 2024-06-06 LAB — CBC
HCT: 41.5 % (ref 39.0–52.0)
Hemoglobin: 13.5 g/dL (ref 13.0–17.0)
MCH: 29.4 pg (ref 26.0–34.0)
MCHC: 32.5 g/dL (ref 30.0–36.0)
MCV: 90.4 fL (ref 80.0–100.0)
Platelets: 228 K/uL (ref 150–400)
RBC: 4.59 MIL/uL (ref 4.22–5.81)
RDW: 11.8 % (ref 11.5–15.5)
WBC: 5.5 K/uL (ref 4.0–10.5)
nRBC: 0 % (ref 0.0–0.2)

## 2024-06-06 LAB — GLUCOSE, CAPILLARY
Glucose-Capillary: 111 mg/dL — ABNORMAL HIGH (ref 70–99)
Glucose-Capillary: 115 mg/dL — ABNORMAL HIGH (ref 70–99)
Glucose-Capillary: 111 mg/dL — ABNORMAL HIGH (ref 70–99)
Glucose-Capillary: 149 mg/dL — ABNORMAL HIGH (ref 70–99)

## 2024-06-06 LAB — BASIC METABOLIC PANEL WITH GFR
Anion gap: 10 (ref 5–15)
BUN: 16 mg/dL (ref 8–23)
CO2: 23 mmol/L (ref 22–32)
Calcium: 9.6 mg/dL (ref 8.9–10.3)
Chloride: 106 mmol/L (ref 98–111)
Creatinine, Ser: 1.43 mg/dL — ABNORMAL HIGH (ref 0.61–1.24)
GFR, Estimated: 52 mL/min — ABNORMAL LOW (ref >60)
Glucose, Bld: 120 mg/dL — ABNORMAL HIGH (ref 70–99)
Potassium: 3.7 mmol/L (ref 3.5–5.1)
Sodium: 139 mmol/L (ref 135–145)

## 2024-06-06 NOTE — Progress Notes (Signed)
 Progress Note  Patient Name: [name] Holt Date of Encounter: 06/06/2024  Primary Cardiologist: Darryle ONEIDA Decent, MD  Subjective   No symptoms except for some fatigue yesterday.  Inpatient Medications    Scheduled Meds:  amLODipine   10 mg Oral Daily   aspirin  EC  81 mg Oral Daily   irbesartan   300 mg Oral Daily   And   hydrochlorothiazide   12.5 mg Oral Daily   insulin  aspart  0-15 Units Subcutaneous TID WC   rosuvastatin   10 mg Oral Daily   Continuous Infusions:  PRN Meds: acetaminophen , hydrALAZINE , nitroGLYCERIN , ondansetron  (ZOFRAN ) IV   Vital Signs    Vitals:   06/05/24 2048 06/06/24 0040 06/06/24 0526 06/06/24 0732  BP: (!) 168/55 (!) 139/57 (!) 154/61 (!) 165/51  Pulse: (!) 40 (!) 41 (!) 40 (!) 37  Resp: 18 16 15 12   Temp: 98.4 F (36.9 C) 98.4 F (36.9 C) 98.1 F (36.7 C) 98.1 F (36.7 C)  TempSrc: Oral Oral Oral Oral  SpO2: 97% 98% 97% 98%  Weight:      Height:        Intake/Output Summary (Last 24 hours) at 06/06/2024 1044 Last data filed at 06/06/2024 0900 Gross per 24 hour  Intake 600 ml  Output --  Net 600 ml   Filed Weights   06/03/24 1414 06/03/24 1809  Weight: 99.8 kg 100 kg    Telemetry     Personally reviewed. CHB consistentlY.  ECG    Not performed today.  Physical Exam   GEN: No acute distress.   Neck: No JVD. Cardiac: RRR, no murmur, rub, or gallop.  Respiratory: Nonlabored. Clear to auscultation bilaterally. GI: Soft, nontender, bowel sounds present. MS: No edema; No deformity. Neuro:  Nonfocal. Psych: Alert and oriented x 3. Normal affect.  Labs    Chemistry Recent Labs  Lab 06/04/24 0443 06/05/24 0443 06/06/24 0353  NA 139 138 139  K 3.1* 3.8 3.7  CL 105 107 106  CO2 23 21* 23  GLUCOSE 107* 102* 120*  BUN 12 14 16   CREATININE 1.32* 1.20 1.43*  CALCIUM  9.6 9.5 9.6  GFRNONAA 58* >60 52*  ANIONGAP 11 10 10      Hematology Recent Labs  Lab 06/04/24 0443 06/05/24 0443 06/06/24 0353  WBC 5.1 5.8  5.5  RBC 4.16* 4.30 4.59  HGB 12.4* 12.6* 13.5  HCT 37.2* 38.5* 41.5  MCV 89.4 89.5 90.4  MCH 29.8 29.3 29.4  MCHC 33.3 32.7 32.5  RDW 11.9 12.0 11.8  PLT 223 225 228    Cardiac Enzymes Recent Labs  Lab 06/03/24 1432 06/03/24 1653  TROPONINIHS 17 17    BNP Recent Labs  Lab 06/03/24 1432  BNP 127.9*     DDimerNo results for input(s): DDIMER in the last 168 hours.    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  Keep n.p.o. after midnight. - Patient complaining of fatigue but no symptoms of lightheadedness, dizziness and syncope. - Telemetry reviewed, he has CHB consistently - Pacing pads intact on his chest - Avoid AV nodal blockers - Please call cardiology on-call if any concerns/questions  HTN, controlled - Continue amlodipine  10 mg once daily, HCTZ 12.5 mg once daily and irbesartan  300 mg once  daily.  Mild aortic valve stenosis - Outpatient surveillance.   Signed, Diannah SHAUNNA Maywood, MD  06/06/2024, 10:44 AM

## 2024-06-06 NOTE — Progress Notes (Signed)
  Progress Note  Patient Name: Johnie Brine Date of Encounter: 06/06/2024 [facility] Cardiologist: Darryle ONEIDA Decent, MD   Interval Summary   No acute overnight events. Patient reports feeling relatively well. No new or acute complaints.   Vital Signs Vitals:   06/06/24 0040 06/06/24 0526 06/06/24 0732 06/06/24 1121  BP: (!) 139/57 (!) 154/61 (!) 165/51 (!) 157/50  Pulse: (!) 41 (!) 40 (!) 37 (!) 40  Resp: 16 15 12 20   Temp: 98.4 F (36.9 C) 98.1 F (36.7 C) 98.1 F (36.7 C) 98.1 F (36.7 C)  TempSrc: Oral Oral Oral Oral  SpO2: 98% 97% 98%   Weight:      Height:        Intake/Output Summary (Last 24 hours) at 06/06/2024 1127 Last data filed at 06/06/2024 0900 Gross per 24 hour  Intake 600 ml  Output --  Net 600 ml      06/03/2024    6:09 PM 06/03/2024    2:14 PM 05/13/2023    2:57 PM  Last 3 Weights  Weight (lbs) 220 lb 7.4 oz 220 lb 226 lb  Weight (kg) 100 kg 99.791 kg 102.513 kg      Telemetry/ECG  SR with intermittent CHB, 2:1 and Mobitz I - Personally Reviewed  Physical Exam  GEN: No acute distress.   Neck: No JVD Cardiac: Bradycardic, regular Respiratory: Clear to auscultation bilaterally. GI: Soft, nontender, non-distended  MS: No edema  Echo 06/04/24:   1. Left ventricular ejection fraction, by estimation, is 70 to 75%. The  left ventricle has hyperdynamic function. The left ventricle has no  regional wall motion abnormalities. There is moderate asymmetric left  ventricular hypertrophy of the basal-septal   segment. Left ventricular diastolic parameters were normal.   2. Right ventricular systolic function is normal. The right ventricular  size is mildly enlarged. Tricuspid regurgitation signal is inadequate for  assessing PA pressure.   3. The mitral valve is normal in structure. Trivial mitral valve  regurgitation. No evidence of mitral stenosis.   4. The aortic valve is tricuspid. There is mild calcification of the  aortic valve. Aortic  valve regurgitation is moderate. Mild aortic valve  stenosis.   5. Aortic dilatation noted. There is dilatation of the ascending aorta,  measuring 43 mm.   6. The inferior vena cava is normal in size with greater than 50%  respiratory variability, suggesting right atrial pressure of 3 mmHg.   Assessment  Patient presented to the hospital with complete heart block. He was previously on bisoprolol. Bisoprolol was stopped yet he continues to have CHB. Narrow escape rhythm, normotensive.   Complete heart block 2nd degree AV block following bisoprolol washout Symptomatic bradycardia  Plan:  -Patient meets criteria for pacemaker implant for symptomatic bradycardia and complete AV block.  -Pacemaker tomorrow. NPO after breakfast.  -Continue holding AVN blocking agents. Can resume bisoprolol after implant.   For questions or updates, please contact [facility] Please consult [email or web address] for contact info under       Signed, Fonda Kitty, MD

## 2024-06-07 ENCOUNTER — Other Ambulatory Visit: Payer: Self-pay

## 2024-06-07 ENCOUNTER — Encounter (HOSPITAL_COMMUNITY): Payer: Self-pay | Admitting: Cardiology

## 2024-06-07 ENCOUNTER — Inpatient Hospital Stay (HOSPITAL_COMMUNITY)
Admission: EM | Disposition: A | Discharge status: Home / Self Care | Payer: Self-pay | Source: Home / Self Care | Attending: Cardiology

## 2024-06-07 HISTORY — PX: PACEMAKER IMPLANT: EP1218

## 2024-06-07 LAB — GLUCOSE, CAPILLARY
Glucose-Capillary: 122 mg/dL — ABNORMAL HIGH (ref 70–99)
Glucose-Capillary: 125 mg/dL — ABNORMAL HIGH (ref 70–99)
Glucose-Capillary: 102 mg/dL — ABNORMAL HIGH (ref 70–99)
Glucose-Capillary: 161 mg/dL — ABNORMAL HIGH (ref 70–99)

## 2024-06-07 LAB — SURGICAL PCR SCREEN
MRSA, PCR: NEGATIVE
Staphylococcus aureus: NEGATIVE

## 2024-06-07 LAB — BASIC METABOLIC PANEL WITH GFR
Anion gap: 9 (ref 5–15)
BUN: 16 mg/dL (ref 8–23)
CO2: 25 mmol/L (ref 22–32)
Calcium: 9.5 mg/dL (ref 8.9–10.3)
Chloride: 106 mmol/L (ref 98–111)
Creatinine, Ser: 1.31 mg/dL — ABNORMAL HIGH (ref 0.61–1.24)
GFR, Estimated: 58 mL/min — ABNORMAL LOW (ref >60)
Glucose, Bld: 124 mg/dL — ABNORMAL HIGH (ref 70–99)
Potassium: 3.8 mmol/L (ref 3.5–5.1)
Sodium: 140 mmol/L (ref 135–145)

## 2024-06-07 LAB — CBC
HCT: 39.8 % (ref 39.0–52.0)
Hemoglobin: 13.1 g/dL (ref 13.0–17.0)
MCH: 29.6 pg (ref 26.0–34.0)
MCHC: 32.9 g/dL (ref 30.0–36.0)
MCV: 90 fL (ref 80.0–100.0)
Platelets: 223 K/uL (ref 150–400)
RBC: 4.42 MIL/uL (ref 4.22–5.81)
RDW: 11.9 % (ref 11.5–15.5)
WBC: 5 K/uL (ref 4.0–10.5)
nRBC: 0 % (ref 0.0–0.2)

## 2024-06-07 SURGERY — PACEMAKER IMPLANT

## 2024-06-07 MED ORDER — HEPARIN (PORCINE) IN NACL 1000-0.9 UT/500ML-% IV SOLN
INTRAVENOUS | Status: DC | PRN
  Administered 2024-06-07: 500 mL

## 2024-06-07 MED ORDER — FENTANYL CITRATE (PF) 100 MCG/2ML IJ SOLN
INTRAMUSCULAR | Status: DC | PRN
  Administered 2024-06-07 (×2): 50 ug via INTRAVENOUS

## 2024-06-07 MED ORDER — SODIUM CHLORIDE 0.9 % IV SOLN: INTRAVENOUS | Status: DC

## 2024-06-07 MED ORDER — SODIUM CHLORIDE 0.9 % IV SOLN
INTRAVENOUS | Status: AC
  Filled 2024-06-07: qty 2

## 2024-06-07 MED ORDER — FENTANYL CITRATE (PF) 100 MCG/2ML IJ SOLN
INTRAMUSCULAR | Status: AC
  Filled 2024-06-07: qty 2

## 2024-06-07 MED ORDER — SODIUM CHLORIDE 0.9 % IV SOLN
80.0000 mg | INTRAVENOUS | Status: AC
  Administered 2024-06-07: 80 mg

## 2024-06-07 MED ORDER — MIDAZOLAM HCL 5 MG/5ML IJ SOLN
INTRAMUSCULAR | Status: DC | PRN
  Administered 2024-06-07 (×2): 1 mg via INTRAVENOUS

## 2024-06-07 MED ORDER — CEFAZOLIN SODIUM-DEXTROSE 2-4 GM/100ML-% IV SOLN: 2.0000 g | INTRAVENOUS | Status: AC

## 2024-06-07 MED ORDER — LIDOCAINE HCL (PF) 1 % IJ SOLN
INTRAMUSCULAR | Status: DC | PRN
  Administered 2024-06-07: 60 mL

## 2024-06-07 MED ORDER — LIDOCAINE HCL 1 % IJ SOLN
INTRAMUSCULAR | Status: AC
  Filled 2024-06-07: qty 60

## 2024-06-07 MED ORDER — MIDAZOLAM HCL 2 MG/2ML IJ SOLN
INTRAMUSCULAR | Status: AC
  Filled 2024-06-07: qty 2

## 2024-06-07 MED ORDER — CEFAZOLIN SODIUM-DEXTROSE 2-4 GM/100ML-% IV SOLN
INTRAVENOUS | Status: AC
  Administered 2024-06-07: 2 g via INTRAVENOUS
  Filled 2024-06-07: qty 100

## 2024-06-07 SURGICAL SUPPLY — 13 items
CABLE SURGICAL S-101-97-12 (CABLE) ×1 IMPLANT
CATH CPS LOCATOR 3D MED (CATHETERS) IMPLANT
LEAD ULTIPACE 52 LPA1231/52 (Lead) IMPLANT
LEAD ULTIPACE 65 LPA1231/65 (Lead) IMPLANT
PACEMAKER ASSURITY DR-RF (Pacemaker) IMPLANT
PAD DEFIB RADIO PHYSIO CONN (PAD) ×1 IMPLANT
SHEATH 7FR PRELUDE SNAP 13 (SHEATH) IMPLANT
SHEATH 9FR PRELUDE SNAP 13 (SHEATH) IMPLANT
SHEATH PROBE COVER 6X72 (BAG) IMPLANT
SLITTER AGILIS HISPRO (INSTRUMENTS) IMPLANT
TOOL HELIX LOCKING (MISCELLANEOUS) IMPLANT
TRAY PACEMAKER INSERTION (PACKS) ×1 IMPLANT
WIRE HI TORQ VERSACORE-J 145CM (WIRE) IMPLANT

## 2024-06-07 NOTE — Plan of Care (Signed)

## 2024-06-07 NOTE — H&P (View-Only) (Signed)
  Patient Name: [name] Holt Date of Encounter: 06/07/2024  Primary Cardiologist: Darryle ONEIDA Decent, MD Electrophysiologist: None  Interval Summary   The patient is doing well today.  At this time, the patient denies chest pain, shortness of breath, or any new concerns.  Vital Signs    Vitals:   06/06/24 1630 06/06/24 2015 06/07/24 0428 06/07/24 0736  BP: (!) 175/54 (!) 162/49 (!) 142/60 (!) 140/54  Pulse: (!) 43   (!) 38  Resp: 20 18 20 20   Temp: 98.2 F (36.8 C) 98 F (36.7 C) 98.2 F (36.8 C) 98.3 F (36.8 C)  TempSrc: Oral Oral Oral Oral  SpO2: 100% 100% 100% 97%  Weight:      Height:        Intake/Output Summary (Last 24 hours) at 06/07/2024 1014 Last data filed at 06/06/2024 1230 Gross per 24 hour  Intake 118 ml  Output --  Net 118 ml   Filed Weights   06/03/24 1414 06/03/24 1809  Weight: 99.8 kg 100 kg    Physical Exam    GEN- NAD, Alert and oriented  Lungs- Clear to ausculation bilaterally, normal work of breathing Cardiac- Slow but regular rate and rhythm, no murmurs, rubs or gallops GI- soft, NT, ND, + BS Extremities- no clubbing or cyanosis. No edema  Telemetry    2nd degree AV block, with intermittent CHB 40s mostly (personally reviewed)  Hospital Course    [name] Holt is a 73 y.o. male with GERD, HTN, HLD, and DM2 who presented to the hospital with complete heart block. He was previously on bisoprolol. Bisoprolol was stopped yet he continues to have CHB. Narrow escape rhythm, normotensive.   Assessment & Plan    Advanced AV Block Symptomatic brady Continue despite BB washout Normal > hyperdynamic EF echo 8/1. Explained risks, benefits, and alternatives to PPM implantation, including but not limited to bleeding, infection, pneumothorax, pericardial effusion, lead dislodgement, heart attack, stroke, or death.  Pt verbalized understanding and agrees to proceed.    Not on OAC   For questions or updates, please contact [facility] Please consult [email or web address] for contact info under     Signed, Ozell Prentice Passey, PA-C  06/07/2024, 10:14 AM    I have seen, examined the patient, and reviewed the above assessment and plan.    Interval:  No acute overnight events. Patient reports feeling relatively well. No new or acute complaints.   General: Well developed, in no acute distress.  Neck: No JVD.  Cardiac: Bradycardic, regular rhythm.  Resp: Normal work of breathing.  Ext: No edema.  Neuro: No gross focal deficits.  Psych: Normal affect.   Assessment and Plan:  Patient presented to the hospital with complete heart block. He was previously on bisoprolol. Bisoprolol was stopped yet he continues to have CHB. Narrow escape rhythm, normotensive.    Complete heart block 2nd degree AV block following bisoprolol washout Symptomatic bradycardia   Plan:  -Patient meets criteria for pacemaker implant for symptomatic bradycardia and complete AV block. Explained risks, benefits, and alternatives to pacemaker implantation, including but not limited to bleeding, infection, damage to heart or lungs, heart attack, stroke, or death.  Pt verbalized understanding and wants to proceed. -Pacemaker today. -Continue holding AVN blocking agents. Can resume bisoprolol after implant.    Fonda Kitty, MD 06/07/2024 12:06 PM

## 2024-06-07 NOTE — TOC Progression Note (Signed)
 Transition of Care [facility]) - Progression Note    Patient Details  Name: [name] Holt MRN: [id number] Date of Birth: 11-11-1950  [facility] CM/SW Contact  Graves-Bigelow, Erminio Deems, RN Phone Number: 06/07/2024, 3:33 PM  Clinical Narrative: Patient was discussed in progression rounds this morning. Plan for pacemaker implant today. Case Manager will continue to follow for disposition needs as the patient progresses.   Expected Discharge Plan: Home/Self Care Barriers to Discharge: No Barriers Identified   Expected Discharge Plan and Services In-house Referral: NA Discharge Planning Services: CM Consult Post Acute Care Choice: NA Living arrangements for the past 2 months: Single Family Home                   DME Agency: NA   Social Drivers of Health (SDOH) Interventions SDOH Screenings   Food Insecurity: No Food Insecurity (06/03/2024)  Housing: Low Risk  (06/03/2024)  Transportation Needs: No Transportation Needs (06/03/2024)  Utilities: Not At Risk (06/03/2024)  Social Connections: Moderately Integrated (06/03/2024)  Tobacco Use: Medium Risk (06/03/2024)    Readmission Risk Interventions     No data to display

## 2024-06-07 NOTE — Progress Notes (Addendum)
  Patient Name: [name] Holt Date of Encounter: 06/07/2024  Primary Cardiologist: Darryle ONEIDA Decent, MD Electrophysiologist: None  Interval Summary   The patient is doing well today.  At this time, the patient denies chest pain, shortness of breath, or any new concerns.  Vital Signs    Vitals:   06/06/24 1630 06/06/24 2015 06/07/24 0428 06/07/24 0736  BP: (!) 175/54 (!) 162/49 (!) 142/60 (!) 140/54  Pulse: (!) 43   (!) 38  Resp: 20 18 20 20   Temp: 98.2 F (36.8 C) 98 F (36.7 C) 98.2 F (36.8 C) 98.3 F (36.8 C)  TempSrc: Oral Oral Oral Oral  SpO2: 100% 100% 100% 97%  Weight:      Height:        Intake/Output Summary (Last 24 hours) at 06/07/2024 1014 Last data filed at 06/06/2024 1230 Gross per 24 hour  Intake 118 ml  Output --  Net 118 ml   Filed Weights   06/03/24 1414 06/03/24 1809  Weight: 99.8 kg 100 kg    Physical Exam    GEN- NAD, Alert and oriented  Lungs- Clear to ausculation bilaterally, normal work of breathing Cardiac- Slow but regular rate and rhythm, no murmurs, rubs or gallops GI- soft, NT, ND, + BS Extremities- no clubbing or cyanosis. No edema  Telemetry    2nd degree AV block, with intermittent CHB 40s mostly (personally reviewed)  Hospital Course    [name] Holt is a 73 y.o. male with GERD, HTN, HLD, and DM2 who presented to the hospital with complete heart block. He was previously on bisoprolol. Bisoprolol was stopped yet he continues to have CHB. Narrow escape rhythm, normotensive.   Assessment & Plan    Advanced AV Block Symptomatic brady Continue despite BB washout Normal > hyperdynamic EF echo 8/1. Explained risks, benefits, and alternatives to PPM implantation, including but not limited to bleeding, infection, pneumothorax, pericardial effusion, lead dislodgement, heart attack, stroke, or death.  Pt verbalized understanding and agrees to proceed.    Not on OAC   For questions or updates, please contact [facility] Please consult [email or web address] for contact info under     Signed, Ozell Prentice Passey, PA-C  06/07/2024, 10:14 AM    I have seen, examined the patient, and reviewed the above assessment and plan.    Interval:  No acute overnight events. Patient reports feeling relatively well. No new or acute complaints.   General: Well developed, in no acute distress.  Neck: No JVD.  Cardiac: Bradycardic, regular rhythm.  Resp: Normal work of breathing.  Ext: No edema.  Neuro: No gross focal deficits.  Psych: Normal affect.   Assessment and Plan:  Patient presented to the hospital with complete heart block. He was previously on bisoprolol. Bisoprolol was stopped yet he continues to have CHB. Narrow escape rhythm, normotensive.    Complete heart block 2nd degree AV block following bisoprolol washout Symptomatic bradycardia   Plan:  -Patient meets criteria for pacemaker implant for symptomatic bradycardia and complete AV block. Explained risks, benefits, and alternatives to pacemaker implantation, including but not limited to bleeding, infection, damage to heart or lungs, heart attack, stroke, or death.  Pt verbalized understanding and wants to proceed. -Pacemaker today. -Continue holding AVN blocking agents. Can resume bisoprolol after implant.    Fonda Kitty, MD 06/07/2024 12:06 PM

## 2024-06-07 NOTE — Interval H&P Note (Signed)
 History and Physical Interval Note:  06/07/2024 12:08 PM  [name] Holt  has presented today for surgery, with the diagnosis of symptomatic bradycardia and AV block.  The various methods of treatment have been discussed with the patient and family. After consideration of risks, benefits and other options for treatment, the patient has consented to  Procedure(s): PACEMAKER IMPLANT (N/A) as a surgical intervention.  The patient's history has been reviewed, patient examined, no change in status, stable for surgery.  I have reviewed the patient's chart and labs.  Questions were answered to the patient's satisfaction.     Fonda Kitty

## 2024-06-07 NOTE — Progress Notes (Addendum)
  Progress Note  Patient Name: [name] Holt Date of Encounter: 06/07/2024 [facility] Cardiologist: Darryle ONEIDA Decent, MD   Interval Summary   No acute complaints.  He feels good.  Planning for PPM today.  Vital Signs Vitals:   06/06/24 1121 06/06/24 1630 06/06/24 2015 06/07/24 0428  BP: (!) 157/50 (!) 175/54 (!) 162/49 (!) 142/60  Pulse: (!) 40 (!) 43    Resp: 20 20 18 20   Temp: 98.1 F (36.7 C) 98.2 F (36.8 C) 98 F (36.7 C) 98.2 F (36.8 C)  TempSrc: Oral Oral Oral Oral  SpO2: 98% 100% 100% 100%  Weight:      Height:        Intake/Output Summary (Last 24 hours) at 06/07/2024 9277 Last data filed at 06/06/2024 1230 Gross per 24 hour  Intake 358 ml  Output --  Net 358 ml      06/03/2024    6:09 PM 06/03/2024    2:14 PM 05/13/2023    2:57 PM  Last 3 Weights  Weight (lbs) 220 lb 7.4 oz 220 lb 226 lb  Weight (kg) 100 kg 99.791 kg 102.513 kg      Telemetry/ECG  Intermittent complete heart block, heart rates around 38.  Also with ventricular escape beats- Personally Reviewed  Physical Exam  GEN: No acute distress.   Neck: No JVD Cardiac: Bradycardic 3 out of 6 murmur Respiratory: Clear to auscultation bilaterally. GI: Soft, nontender, non-distended  MS: No edema  Patient Profile Patient with past medical history significant for hypertension, hyperlipidemia, GERD, diabetes, mild AS, CKD, OSA.  Assessment & Plan   Complete heart block  Ventricular escape beats Presented with chest pain, shortness of breath. Continued to have 2nd degree AV block despite bisoprolol washout. Plan for PPM today. Bisoprolol held, per EP can continue this after PPM.  HTN Elevated, will adjust meds post PPM.  Continue amlodipine  10 mg once daily, HCTZ 12.5 mg once daily and irbesartan  300 mg once daily.   Mild aortic valve stenosis  Echocardiogram with an EF 70 to 75% with moderate asymmetric LVH.  Moderate AR, mild AS.  Aortic dilatation 43 mm.  Continue to follow  annually.  OSA Compliant with CPAP  Diabetes with CKD A1c 5.8% 4 days ago excellent control.  Would consider SGLT2 inhibitor outpatient.  Hyperlipidemia LDL 39 this admission.   Continue rosuvastatin  10 mg    For questions or updates, please contact [facility] Please consult [email or web address] for contact info under       Signed, Thom LITTIE Sluder, PA-C     I have seen and examined the patient along with Thom LITTIE Sluder, PA-C.  I have reviewed the chart, notes and new data.  I agree with PA/NP's note.  Key new complaints: OK at rest Key examination changes: cannon a waves, otw unremarkable CV exam Key new findings / data: complete heart block with narrow QRS complex junctional escape rhythm  PLAN: For dual chamber permanent pacemaker this afternoon. Complications/risks/benefits reviewed.  Loran Auguste, MD, FACC [facility] [phone] 06/07/2024, 12:31 PM

## 2024-06-08 ENCOUNTER — Telehealth: Payer: Self-pay | Admitting: Cardiology

## 2024-06-08 ENCOUNTER — Inpatient Hospital Stay (HOSPITAL_COMMUNITY)

## 2024-06-08 LAB — CBC
HCT: 40.7 % (ref 39.0–52.0)
Hemoglobin: 13.4 g/dL (ref 13.0–17.0)
MCH: 29.5 pg (ref 26.0–34.0)
MCHC: 32.9 g/dL (ref 30.0–36.0)
MCV: 89.6 fL (ref 80.0–100.0)
Platelets: 230 K/uL (ref 150–400)
RBC: 4.54 MIL/uL (ref 4.22–5.81)
RDW: 11.9 % (ref 11.5–15.5)
WBC: 5.9 K/uL (ref 4.0–10.5)
nRBC: 0 % (ref 0.0–0.2)

## 2024-06-08 LAB — BASIC METABOLIC PANEL WITH GFR
Anion gap: 10 (ref 5–15)
BUN: 13 mg/dL (ref 8–23)
CO2: 22 mmol/L (ref 22–32)
Calcium: 9.4 mg/dL (ref 8.9–10.3)
Chloride: 105 mmol/L (ref 98–111)
Creatinine, Ser: 1.31 mg/dL — ABNORMAL HIGH (ref 0.61–1.24)
GFR, Estimated: 58 mL/min — ABNORMAL LOW (ref >60)
Glucose, Bld: 112 mg/dL — ABNORMAL HIGH (ref 70–99)
Potassium: 3.6 mmol/L (ref 3.5–5.1)
Sodium: 137 mmol/L (ref 135–145)

## 2024-06-08 LAB — GLUCOSE, CAPILLARY: Glucose-Capillary: 103 mg/dL — ABNORMAL HIGH (ref 70–99)

## 2024-06-08 MED ORDER — ROSUVASTATIN CALCIUM 10 MG PO TABS: 10.0000 mg | ORAL_TABLET | Freq: Every day | ORAL | 3 refills | Status: AC

## 2024-06-08 MED ORDER — ACETAMINOPHEN 325 MG PO TABS: 650.0000 mg | ORAL_TABLET | ORAL | Status: AC | PRN

## 2024-06-08 NOTE — Care Management Important Message (Signed)
 Important Message  Patient Details  Name: [name] Holt MRN: [id number] Date of Birth: [birth year]   Important Message Given:  Yes - Medicare IM     [name] Sharps 06/08/2024, 9:40 AM

## 2024-06-08 NOTE — Discharge Instructions (Signed)
 After Your Pacemaker   You have a Abbott Pacemaker  ACTIVITY Do not lift your arm above shoulder height for 1 week after your procedure. After 7 days, you may progress as below.  You should remove your sling 24 hours after your procedure, unless otherwise instructed by your provider.     Tuesday June 15, 2024  Wednesday June 16, 2024 Thursday June 17, 2024 Friday June 18, 2024   Do not lift, push, pull, or carry anything over 10 pounds with the affected arm until 6 weeks (Tuesday July 20, 2024 ) after your procedure.   You may drive AFTER your wound check, unless you have been told otherwise by your provider.   Ask your healthcare provider when you can go back to work   INCISION/Dressing If you are on a blood thinner such as Coumadin, Xarelto, Eliquis, Plavix, or Pradaxa please confirm with your provider when this should be resumed.  Resume ASA Friday.  If large square, outer bandage is left in place, this can be removed after 24 hours from your procedure. Do not remove steri-strips or glue as below.   If a PRESSURE DRESSING (a bulky dressing that usually goes up over your shoulder) was applied or left in place, please follow instructions given by your provider on when to return to have this removed.   Monitor your Pacemaker site for redness, swelling, and drainage. Call the device clinic at [phone] if you experience these symptoms or fever/chills.  If your incision is sealed with Steri-strips or staples, you may shower 7 days after your procedure or when told by your provider. Do not remove the steri-strips or let the shower hit directly on your site. You may wash around your site with soap and water.    If you were discharged in a sling, please do not wear this during the day more than 48 hours after your surgery unless otherwise instructed. This may increase the risk of stiffness and soreness in your shoulder.   Avoid lotions, ointments, or perfumes over your  incision until it is well-healed.  You may use a hot tub or a pool AFTER your wound check appointment if the incision is completely closed.  Pacemaker Alerts:  Some alerts are vibratory and others beep. These are NOT emergencies. Please call our office to let us  know. If this occurs at night or on weekends, it can wait until the next business day. Send a remote transmission.  If your device is capable of reading fluid status (for heart failure), you will be offered monthly monitoring to review this with you.   DEVICE MANAGEMENT Remote monitoring is used to monitor your pacemaker from home. This monitoring is scheduled every 91 days by our office. It allows us  to keep an eye on the functioning of your device to ensure it is working properly. You will routinely see your Electrophysiologist annually (more often if necessary).   You should receive your ID card for your new device in 4-8 weeks. Keep this card with you at all times once received. Consider wearing a medical alert bracelet or necklace.  Your Pacemaker may be MRI compatible. This will be discussed at your next office visit/wound check.  You should avoid contact with strong electric or magnetic fields.   Do not use amateur (ham) radio equipment or electric (arc) welding torches. MP3 player headphones with magnets should not be used. Some devices are safe to use if held at least 12 inches (30 cm) from your Pacemaker. These include  power tools, [employer], and speakers. If you are unsure if something is safe to use, ask your health care provider.  When using your cell phone, hold it to the ear that is on the opposite side from the Pacemaker. Do not leave your cell phone in a pocket over the Pacemaker.  You may safely use electric blankets, heating pads, computers, and microwave ovens.  Call the office right away if: You have chest pain. You feel more short of breath than you have felt before. You feel more light-headed than you have  felt before. Your incision starts to open up.  This information is not intended to replace advice given to you by your health care provider. Make sure you discuss any questions you have with your health care provider.

## 2024-06-08 NOTE — Discharge Summary (Cosign Needed)
 ELECTROPHYSIOLOGY PROCEDURE DISCHARGE SUMMARY    Patient ID: [name] Holt,  MRN: [id number], DOB/AGE: June 27, 1951 73 y.o.  Admit date: 06/03/2024 Discharge date: 06/08/2024  Primary Care Physician: Clarice Nottingham, MD  Primary Cardiologist: Darryle ONEIDA Decent, MD  Electrophysiologist: Dr. Kennyth   Primary Discharge Diagnosis:  Symptomatic bradycardia status post pacemaker implantation this admission  Secondary Discharge Diagnosis:  HTN HLD  No Known Allergies   Procedures This Admission:  1.  Implantation of a Abbott Dual Chamber PPM on 06/07/2024 by Dr. Kennyth with details as listed in the operative note.  There were no immediate post procedure complications.   2.  CXR on 06/08/2024 demonstrated no pneumothorax status post device implantation.       Brief HPI: [name] Holt is a 73 y.o. male was admitted for symptomatic bradycardia and CHB and electrophysiology team asked to see for consideration of PPM implantation.  Past medical history includes above.  The patient has had AV block without reversible causes identified.  Risks, benefits, and alternatives to PPM implantation were reviewed with the patient who wished to proceed.   Hospital Course:  The patient was admitted and underwent implantation of a Abbott dual chamber PPM with details as outlined above.  He was monitored on telemetry overnight which demonstrated appropriate pacing.  Left chest was without hematoma or ecchymosis.  The device was interrogated and found to be functioning normally.  CXR was obtained and demonstrated no pneumothorax status post device implantation.  Wound care, arm mobility, and restrictions were reviewed with the patient.  The patient was examined and considered stable for discharge to home.    Anticoagulation resumption This patient is not on anticoagulation     Physical Exam: Vitals:   06/07/24 2121 06/08/24 0146 06/08/24 0312 06/08/24 0809  BP: 139/77 138/73 (!) 149/71 (!) 141/67   Pulse: 74 75 82   Resp:    18  Temp:  98.2 F (36.8 C) 98.2 F (36.8 C) 98.3 F (36.8 C)  TempSrc:  Oral Oral Oral  SpO2: 94% 96% 97%   Weight:      Height:        GEN- NAD. A&O x 3.  HEENT: Normocephalic, atraumatic Lungs- CTAB, Normal effort.  Heart- RRR, No M/G/R.  GI- Soft, NT, ND.  Extremities- No clubbing, cyanosis, or edema;  Skin- warm and dry, no rash or lesion, left chest without hematoma/ecchymosis  Discharge Medications:  Allergies as of 06/08/2024   No Known Allergies      Medication List     PAUSE taking these medications    aspirin  EC 81 MG tablet Wait to take this until: June 11, 2024 Take 81 mg by mouth daily.       TAKE these medications    acetaminophen  325 MG tablet Commonly known as: TYLENOL  Take 2 tablets (650 mg total) by mouth every 4 (four) hours as needed for headache or mild pain (pain score 1-3).   amLODipine  10 MG tablet Commonly known as: NORVASC  TAKE 1 TABLET BY MOUTH EVERY DAY **MUST SCHEDULE APPT FOR FUTURE REFILLS**   bisoprolol 10 MG tablet Commonly known as: ZEBETA Take 10 mg by mouth daily.   cetirizine 10 MG tablet Commonly known as: ZYRTEC Take 10 mg by mouth daily as needed for allergies.   cyanocobalamin  100 MCG tablet Take 100 mcg by mouth daily.   famotidine 20 MG tablet Commonly known as: PEPCID Take 20 mg by mouth at bedtime.   metFORMIN 500 MG 24 hr tablet Commonly  known as: GLUCOPHAGE-XR Take 500 mg by mouth in the morning and at bedtime.   olmesartan -hydrochlorothiazide  40-12.5 MG tablet Commonly known as: BENICAR  HCT 1 tablet Orally Once a day   pantoprazole 40 MG tablet Commonly known as: PROTONIX Take 40 mg by mouth daily.   rosuvastatin  10 MG tablet Commonly known as: CRESTOR  Take 1 tablet (10 mg total) by mouth daily. What changed:  medication strength how much to take Another medication with the same name was removed. Continue taking this medication, and follow the directions you  see here.   tamsulosin 0.4 MG Caps capsule Commonly known as: FLOMAX Take 0.4 mg by mouth daily.        Disposition:  Home with usual follow up as in AVS   Signed, Ozell Prentice Passey, PA-C  06/08/2024 9:01 AM  I have seen, examined the patient, and reviewed the above assessment and plan.    Hospital Course: Patient presented with symptomatic bradycardia was found to have complete heart block.  He underwent dual-chamber pacemaker implant.  He was monitored overnight with no obvious complications.  On the morning of discharge, he reported feeling relatively well with no new or acute complaints.  He was discharged home with follow-up as noted below.  General: Well developed, in no acute distress.  Neck: No JVD.  Cardiac: Normal rate, regular rhythm. Left chest pacer pocket without hematoma or bleeding. Resp: Normal work of breathing.  Ext: No edema.  Neuro: No gross focal deficits.  Psych: Normal affect.   Plan:  - Chest x-ray with appropriate lead positions. -Bedside device interrogation was performed, appropriate device function and stable lead parameters. -EKG consistent with left-sided conduction system capture. -Usual post implant instructions were provided regarding wound care and activity restrictions. -Patient will follow-up in device clinic in approximately 2 weeks.  Follow-up with me in 3 months.  Total duration of discharge encounter 45 minutes.  Fonda Kitty, MD 06/09/2024 9:17 AM

## 2024-06-08 NOTE — Telephone Encounter (Signed)
 Error

## 2024-06-09 ENCOUNTER — Telehealth: Payer: Self-pay | Admitting: Cardiology

## 2024-06-09 ENCOUNTER — Telehealth: Payer: Self-pay

## 2024-06-09 NOTE — Transitions of Care (Post Inpatient/ED Visit) (Signed)
 06/09/2024  Name: [name] Holt MRN: [id number] DOB: [birth year]  Today's TOC FU Call Status: Today's TOC FU Call Status:: Successful TOC FU Call Completed TOC FU Call Complete Date: 06/09/24 Patient's Name and Date of Birth confirmed.  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 How have you been since you were released from the hospital?: Better Any questions or concerns?: No  Items Reviewed: Did you receive and understand the discharge instructions provided?: Yes Medications obtained,verified, and reconciled?: Yes (Medications Reviewed) Any new allergies since your discharge?: No Dietary orders reviewed?: Yes Type of Diet Ordered:: Low sodium heart healthy Do you have support at home?: Yes People in Home [RPT]: spouse Name of Support/Comfort Primary Source: Wife in the home  Medications Reviewed Today: Medications Reviewed Today     Reviewed by Samie Barclift, RN (Case Manager) on 06/09/24 at 1148  Med List Status: <None>   Medication Order Taking? Sig Documenting Provider Last Dose Status Informant  acetaminophen  (TYLENOL ) 325 MG tablet [id number]  Take 2 tablets (650 mg total) by mouth every 4 (four) hours as needed for headache or mild pain (pain score 1-3). Lesia Ozell Barter, PA-C  Active   amLODipine  (NORVASC ) 10 MG tablet [id number] Yes TAKE 1 TABLET BY MOUTH EVERY DAY **MUST SCHEDULE APPT FOR FUTURE REFILLS** O'Neal, Darryle Ned, MD  Active Self, Pharmacy Records  aspirin  81 MG EC tablet [id number]  Take 81 mg by mouth daily. [provider]  Active Self, Pharmacy Records  bisoprolol (ZEBETA) 10 MG tablet [id number] Yes Take 10 mg by mouth daily. [provider]  Active Self, Pharmacy Records  cetirizine (ZYRTEC) 10 MG tablet  [id number] Yes Take 10 mg by mouth daily as needed for allergies. [provider]  Active Self, Pharmacy Records  cyanocobalamin  100 MCG tablet [id number] Yes Take 100 mcg by mouth daily. [provider]  Active Self, Pharmacy Records  famotidine (PEPCID) 20 MG tablet [id number] Yes Take 20 mg by mouth at bedtime. [provider]  Active Self, Pharmacy Records  metFORMIN (GLUCOPHAGE-XR) 500 MG 24 hr tablet [id number] Yes Take 500 mg by mouth in the morning and at bedtime. [provider]  Active Self, Pharmacy Records  olmesartan -hydrochlorothiazide  (BENICAR  HCT) 40-12.5 MG tablet [id number] Yes 1 tablet Orally Once a day [provider]  Active Self, Pharmacy Records  pantoprazole (PROTONIX) 40 MG tablet [id number] Yes Take 40 mg by mouth daily. [provider]  Active Self, Pharmacy Records  rosuvastatin  (CRESTOR ) 10 MG tablet [id number] Yes Take 1 tablet (10 mg total) by mouth daily. Lesia Ozell Barter, PA-C  Active   tamsulosin (FLOMAX) 0.4 MG CAPS capsule [id number] Yes Take 0.4 mg by mouth daily. [provider]  Active Self, Pharmacy Records            Home Care and Equipment/Supplies: Were Home Health Services Ordered?: NA Any new equipment or medical supplies ordered?: NA  Functional Questionnaire: Do you need assistance with bathing/showering or dressing?: No Do you need assistance with meal preparation?: No Do you need assistance with eating?: No Do you have difficulty maintaining continence: No Do you need assistance with getting out of bed/getting out of a chair/moving?: No Do you have difficulty managing or taking your medications?: No  Follow up appointments reviewed: PCP Follow-up appointment confirmed?: No (patient advised to call PCP for follow up) MD Provider Line Number:[phone]  Given: No Specialist Hospital Follow-up appointment confirmed?: Yes Date of Specialist follow-up appointment?:  06/25/24 Follow-Up Specialty Provider:: [place] Do you need transportation to your follow-up appointment?: No Do you understand care options if your condition(s) worsen?: Yes-patient verbalized understanding  SDOH Interventions Today    Flowsheet Row Most Recent Value  SDOH Interventions   Food Insecurity Interventions Intervention Not Indicated  Housing Interventions Intervention Not Indicated  Transportation Interventions Intervention Not Indicated  Utilities Interventions Intervention Not Indicated    Adaline Trejos J. Lielle Vandervort RN, MSN [facility]  [facility], [facility] RN Care Manager Direct Dial: [phone]  Fax: [phone] Website: [email or web address]

## 2024-06-09 NOTE — Telephone Encounter (Signed)
 Follow-up after same day discharge: Implant date: 06/07/24 MD: Fonda Kitty, MD Device: Abbott- Dual chamber PPM with LABBAP lead Location: Left chest   Wound check visit: Friday 06/25/24 @ 10:00 am with Jodie Passey, PA 90 day MD follow-up: Friday 09/17/24 @ 3:45 pm with Dr. Kitty  Remote Transmission received:Yes  Dressing/sling removed: Yes  Confirm OAC restart on: N/A  Please continue to monitor your cardiac device site for redness, swelling, and drainage. Call the device clinic at [phone] if you experience these symptoms, fever/chills, or have questions about your device.   Remote monitoring is used to monitor your cardiac device from home. This monitoring is scheduled every 91 days by our office. It allows us  to keep an eye on the functioning of your device to ensure it is working properly.

## 2024-06-09 NOTE — Patient Instructions (Signed)
 Visit Information  Thank you for taking time to visit with me today. Please don't hesitate to contact me if I can be of assistance to you before our next scheduled telephone appointment.  Our next appointment is by telephone on 06/17/24  at 1100 am  Call the office right away if: You have chest pain. You feel more short of breath than you have felt before. You feel more light-headed than you have felt before. Your incision starts to open up.  Following is a copy of your care plan:   Goals Addressed             This Visit's Progress    VBCI Transitions of Care (TOC) Care Plan       Problems:  Recent Hospitalization for treatment of Complete heart block with pacemaker insertion Knowledge Deficit Related to heart block and pacemaker  Goal:  Over the next 30 days, the patient will not experience hospital readmission  Interventions:  Transitions of Care: Doctor Visits  - discussed the importance of doctor visits Post-op wound/incision care reviewed with patient/caregiver Reviewed Signs and symptoms of infection Avoid raising arm above pacemaker Avoid heavy lifting pushing or pulling Keep site clean and dry When you receive your pacemaker ID card, keep it with you at all times.  Consider a medical ID bracelet     Patient Self Care Activities:  Call provider office for new concerns or questions  Notify RN Care Manager of TOC call rescheduling needs Participate in Transition of Care Program/Attend TOC scheduled calls Take medications as prescribed    Plan:  The patient has been provided with contact information for the care management team and has been advised to call with any health related questions or concerns.         Patient verbalizes understanding of instructions and care plan provided today and agrees to view in MyChart. Active MyChart status and patient understanding of how to access instructions and care plan via MyChart confirmed with patient.     The patient  has been provided with contact information for the care management team and has been advised to call with any health related questions or concerns.   Please call the care guide team at [phone] if you need to cancel or reschedule your appointment.   Please call the Suicide and Crisis Lifeline: 988 if you are experiencing a Mental Health or Behavioral Health Crisis or need someone to talk to.  Cailee Blanke J. Malaisha Silliman RN, MSN [facility], [facility] RN Care Manager Direct Dial: [phone]  Fax: [phone] Website: [email or web address]

## 2024-06-10 MED FILL — Midazolam HCl Inj 2 MG/2ML (Base Equivalent): INTRAMUSCULAR | Qty: 2 | Status: AC

## 2024-06-17 ENCOUNTER — Other Ambulatory Visit: Payer: Self-pay

## 2024-06-17 NOTE — Patient Instructions (Signed)
 Visit Information  Thank you for taking time to visit with me today. Please don't hesitate to contact me if I can be of assistance to you before our next scheduled telephone appointment.  Our next appointment is by telephone on 06/24/24 at 1100 am  Following is a copy of your care plan:   Goals Addressed             This Visit's Progress    VBCI Transitions of Care (TOC) Care Plan       Problems:  Recent Hospitalization for treatment of Complete heart block with pacemaker insertion Knowledge Deficit Related to heart block and pacemaker  Goal:  Over the next 30 days, the patient will not experience hospital readmission  Interventions:  Transitions of Care: Doctor Visits  - discussed the importance of doctor visits Post-op wound/incision care reviewed with patient/caregiver Reviewed Signs and symptoms of infection Avoid raising arm above pacemaker Avoid heavy lifting pushing or pulling Keep site clean and dry When you receive your pacemaker ID card, keep it with you at all times.  Discussed  medical ID bracelet     Patient Self Care Activities:  Call provider office for new concerns or questions  Notify RN Care Manager of TOC call rescheduling needs Participate in Transition of Care Program/Attend TOC scheduled calls Take medications as prescribed    Plan:  The patient has been provided with contact information for the care management team and has been advised to call with any health related questions or concerns.         Patient verbalizes understanding of instructions and care plan provided today and agrees to view in MyChart. Active MyChart status and patient understanding of how to access instructions and care plan via MyChart confirmed with patient.     The patient has been provided with contact information for the care management team and has been advised to call with any health related questions or concerns.   Please call the care guide team at [phone] if  you need to cancel or reschedule your appointment.   Please call the Suicide and Crisis Lifeline: 988 if you are experiencing a Mental Health or Behavioral Health Crisis or need someone to talk to.  Cambria Osten J. Iram Lundberg RN, MSN [facility], [facility] RN Care Manager Direct Dial: [phone]  Fax: [phone] Website: [email or web address]

## 2024-06-17 NOTE — Transitions of Care (Post Inpatient/ED Visit) (Signed)
 Transition of Care week 2  Visit Note  06/17/2024  Name: [name] Holt MRN: [id number]          DOB: 1951/05/27  Situation: Patient enrolled in [facility] 30-day program. Visit completed with patient by telephone.   Background:   Initial Transition Care Management Follow-up Telephone Call    Past Medical History:  Diagnosis Date   Allergic rhinitis    Chest pain    NON CARDIAC   CKD (chronic kidney disease)    Diabetes mellitus without complication (HCC)    GERD (gastroesophageal reflux disease)    Hyperlipidemia    Hypertension    Paresthesia    face   Sleep apnea     Assessment: Patient Reported Symptoms: Cognitive Cognitive Status: Alert and oriented to person, place, and time, Normal speech and language skills      Neurological Neurological Review of Symptoms: No symptoms reported    HEENT HEENT Symptoms Reported: No symptoms reported      Cardiovascular Cardiovascular Symptoms Reported: No symptoms reported Cardiovascular Management Strategies: Routine screening Cardiovascular Comment: Follow up appointment for pacemaker placement 8/22  Respiratory Respiratory Symptoms Reported: No symptoms reported    Endocrine Endocrine Symptoms Reported: No symptoms reported Is patient diabetic?: Yes Is patient checking blood sugars at home?: Yes List most recent blood sugar readings, include date and time of day: Blood sugars runiing in the 100's    Gastrointestinal Gastrointestinal Symptoms Reported: No symptoms reported      Genitourinary Genitourinary Symptoms Reported: No symptoms reported    Integumentary Integumentary Symptoms Reported: Incision Additional Integumentary Details: Pacemaker site left chest- no signs of infection Skin Management Strategies: Routine screening Skin Self-Management Outcome: 4 (good)  Musculoskeletal Musculoskelatal Symptoms Reviewed: No symptoms reported        Psychosocial Psychosocial Symptoms Reported: No symptoms reported          There were no vitals filed for this visit.  Medications Reviewed Today     Reviewed by Lian Tanori, RN (Case Manager) on 06/17/24 at 1118  Med List Status: <None>   Medication Order Taking? Sig Documenting Provider Last Dose Status Informant  acetaminophen  (TYLENOL ) 325 MG tablet [id number] Yes Take 2 tablets (650 mg total) by mouth every 4 (four) hours as needed for headache or mild pain (pain score 1-3). Lesia Ozell Barter, PA-C  Active   amLODipine  (NORVASC ) 10 MG tablet [id number] Yes TAKE 1 TABLET BY MOUTH EVERY DAY **MUST SCHEDULE APPT FOR FUTURE REFILLS** O'Neal, Darryle Ned, MD  Active Self, Pharmacy Records  aspirin  81 MG EC tablet [id number] Yes Take 81 mg by mouth daily. [provider]  Active Self, Pharmacy Records  bisoprolol (ZEBETA) 10 MG tablet [id number] Yes Take 10 mg by mouth daily. [provider]  Active Self, Pharmacy Records  cetirizine (ZYRTEC) 10 MG tablet [id number] Yes Take 10 mg by mouth daily as needed for allergies. [provider]  Active Self, Pharmacy Records  cyanocobalamin  100 MCG tablet [id number] Yes Take 100 mcg by mouth daily. [provider]  Active Self, Pharmacy Records  famotidine (PEPCID) 20 MG tablet [id number] Yes Take 20 mg by mouth at bedtime. [provider]  Active Self, Pharmacy Records  metFORMIN (GLUCOPHAGE-XR) 500 MG 24 hr tablet [id number] Yes Take 500 mg by mouth in the morning and at bedtime. [provider]  Active Self, Pharmacy Records  olmesartan -hydrochlorothiazide  (BENICAR  HCT) 40-12.5 MG tablet [id number] Yes 1 tablet Orally Once a day [provider]  Active Self, Pharmacy Records  pantoprazole (PROTONIX)  40 MG tablet [id number] Yes Take 40 mg by mouth daily. [provider]  Active Self, Pharmacy Records  rosuvastatin  (CRESTOR ) 10 MG tablet [id number] Yes Take 1 tablet (10 mg total) by mouth daily. Lesia Ozell Barter, PA-C  Active   tamsulosin (FLOMAX)  0.4 MG CAPS capsule [id number] Yes Take 0.4 mg by mouth daily. [provider]  Active Self, Pharmacy Records            Goals Addressed             This Visit's Progress    VBCI Transitions of Care (TOC) Care Plan       Problems:  Recent Hospitalization for treatment of Complete heart block with pacemaker insertion Knowledge Deficit Related to heart block and pacemaker  Goal:  Over the next 30 days, the patient will not experience hospital readmission  Interventions:  Transitions of Care: Doctor Visits  - discussed the importance of doctor visits Post-op wound/incision care reviewed with patient/caregiver Reviewed Signs and symptoms of infection Avoid raising arm above pacemaker Avoid heavy lifting pushing or pulling Keep site clean and dry When you receive your pacemaker ID card, keep it with you at all times.  Discussed  medical ID bracelet     Patient Self Care Activities:  Call provider office for new concerns or questions  Notify RN Care Manager of TOC call rescheduling needs Participate in Transition of Care Program/Attend TOC scheduled calls Take medications as prescribed    Plan:  The patient has been provided with contact information for the care management team and has been advised to call with any health related questions or concerns.         Recommendation:   Continue Current Plan of Care  Follow Up Plan:   Telephone follow-up in 1 week  Gustabo Gordillo J. Daking Westervelt RN, MSN [facility], [facility] RN Care Manager Direct Dial: [phone]  Fax: [phone] Website: [email or web address]

## 2024-06-22 DIAGNOSIS — R931 Abnormal findings on diagnostic imaging of heart and coronary circulation: Secondary | ICD-10-CM | POA: Diagnosis not present

## 2024-06-22 DIAGNOSIS — I2510 Atherosclerotic heart disease of native coronary artery without angina pectoris: Secondary | ICD-10-CM | POA: Diagnosis not present

## 2024-06-22 DIAGNOSIS — E7841 Elevated Lipoprotein(a): Secondary | ICD-10-CM | POA: Diagnosis not present

## 2024-06-22 DIAGNOSIS — E7800 Pure hypercholesterolemia, unspecified: Secondary | ICD-10-CM | POA: Diagnosis not present

## 2024-06-22 DIAGNOSIS — I10 Essential (primary) hypertension: Secondary | ICD-10-CM | POA: Diagnosis not present

## 2024-06-22 DIAGNOSIS — Z09 Encounter for follow-up examination after completed treatment for conditions other than malignant neoplasm: Secondary | ICD-10-CM | POA: Diagnosis not present

## 2024-06-22 DIAGNOSIS — Z950 Presence of cardiac pacemaker: Secondary | ICD-10-CM | POA: Diagnosis not present

## 2024-06-22 DIAGNOSIS — E1169 Type 2 diabetes mellitus with other specified complication: Secondary | ICD-10-CM | POA: Diagnosis not present

## 2024-06-24 ENCOUNTER — Other Ambulatory Visit: Payer: Self-pay

## 2024-06-24 NOTE — Patient Instructions (Signed)
 Visit Information  Thank you for taking time to visit with me today. Please don't hesitate to contact me if I can be of assistance to you before our next scheduled telephone appointment.  Our next appointment is by telephone on 07/01/24 at 1100 am  Following is a copy of your care plan:   Goals Addressed             This Visit's Progress    VBCI Transitions of Care (TOC) Care Plan       Problems:  Recent Hospitalization for treatment of Complete heart block with pacemaker insertion Knowledge Deficit Related to heart block and pacemaker PCP visit 8/19 and Cardiology 8/22  Goal:  Over the next 30 days, the patient will not experience hospital readmission  Interventions:  Transitions of Care: Doctor Visits  - discussed the importance of doctor visits Post-op wound/incision care reviewed with patient/caregiver Reviewed Signs and symptoms of infection Avoid raising arm above pacemaker Avoid heavy lifting pushing or pulling Keep site clean and dry When you receive your pacemaker ID card, keep it with you at all times.      Patient Self Care Activities:  Call provider office for new concerns or questions  Notify RN Care Manager of TOC call rescheduling needs Participate in Transition of Care Program/Attend TOC scheduled calls Take medications as prescribed    Plan:  The patient has been provided with contact information for the care management team and has been advised to call with any health related questions or concerns.         Patient verbalizes understanding of instructions and care plan provided today and agrees to view in MyChart. Active MyChart status and patient understanding of how to access instructions and care plan via MyChart confirmed with patient.     The patient has been provided with contact information for the care management team and has been advised to call with any health related questions or concerns.   Please call the care guide team at [phone]  if you need to cancel or reschedule your appointment.   Please call the Suicide and Crisis Lifeline: 988 if you are experiencing a Mental Health or Behavioral Health Crisis or need someone to talk to.  Melisssa Donner J. Maikol Grassia RN, MSN [facility], [facility] RN Care Manager Direct Dial: [phone]  Fax: [phone] Website: [email or web address]

## 2024-06-24 NOTE — Transitions of Care (Post Inpatient/ED Visit) (Signed)
 Transition of Care week 3  Visit Note  06/24/2024  Name: [name] Holt MRN: [id number]          DOB: 1951/04/12  Situation: Patient enrolled in [facility] 30-day program. Visit completed with patient by telephone.   Background:   Initial Transition Care Management Follow-up Telephone Call    Past Medical History:  Diagnosis Date   Allergic rhinitis    Chest pain    NON CARDIAC   CKD (chronic kidney disease)    Diabetes mellitus without complication (HCC)    GERD (gastroesophageal reflux disease)    Hyperlipidemia    Hypertension    Paresthesia    face   Sleep apnea     Assessment: Patient Reported Symptoms: Cognitive Cognitive Status: Alert and oriented to person, place, and time, Normal speech and language skills      Neurological Neurological Review of Symptoms: No symptoms reported    HEENT HEENT Symptoms Reported: No symptoms reported      Cardiovascular Cardiovascular Symptoms Reported: No symptoms reported    Respiratory Respiratory Symptoms Reported: No symptoms reported    Endocrine Endocrine Symptoms Reported: No symptoms reported Is patient diabetic?: Yes Is patient checking blood sugars at home?: Yes List most recent blood sugar readings, include date and time of day: Recent Blood sugar 127 Endocrine Self-Management Outcome: 4 (good)  Gastrointestinal Gastrointestinal Symptoms Reported: No symptoms reported      Genitourinary Genitourinary Symptoms Reported: No symptoms reported    Integumentary Integumentary Symptoms Reported: Incision Additional Integumentary Details: Pacemaker site- no signs of infection    Musculoskeletal Musculoskelatal Symptoms Reviewed: No symptoms reported        Psychosocial Psychosocial Symptoms Reported: No symptoms reported         Vitals:   06/24/24 1142  BP: 131/76    Medications Reviewed Today     Reviewed by Emileigh Kellett, RN (Case Manager) on 06/24/24 at 1111  Med List Status: <None>   Medication Order  Taking? Sig Documenting Provider Last Dose Status Informant  acetaminophen  (TYLENOL ) 325 MG tablet [id number] Yes Take 2 tablets (650 mg total) by mouth every 4 (four) hours as needed for headache or mild pain (pain score 1-3). Lesia Ozell Barter, PA-C  Active   amLODipine  (NORVASC ) 10 MG tablet [id number] Yes TAKE 1 TABLET BY MOUTH EVERY DAY **MUST SCHEDULE APPT FOR FUTURE REFILLS** O'Neal, Darryle Ned, MD  Active Self, Pharmacy Records  aspirin  81 MG EC tablet [id number] Yes Take 81 mg by mouth daily. [provider]  Active Self, Pharmacy Records  bisoprolol (ZEBETA) 10 MG tablet [id number] Yes Take 10 mg by mouth daily. [provider]  Active Self, Pharmacy Records  cetirizine (ZYRTEC) 10 MG tablet [id number] Yes Take 10 mg by mouth daily as needed for allergies. [provider]  Active Self, Pharmacy Records  cyanocobalamin  100 MCG tablet [id number] Yes Take 100 mcg by mouth daily. [provider]  Active Self, Pharmacy Records  famotidine (PEPCID) 20 MG tablet [id number] Yes Take 20 mg by mouth at bedtime. [provider]  Active Self, Pharmacy Records  metFORMIN (GLUCOPHAGE-XR) 500 MG 24 hr tablet [id number] Yes Take 500 mg by mouth in the morning and at bedtime. [provider]  Active Self, Pharmacy Records  olmesartan -hydrochlorothiazide  (BENICAR  HCT) 40-12.5 MG tablet [id number] Yes 1 tablet Orally Once a day [provider]  Active Self, Pharmacy Records  pantoprazole (PROTONIX) 40 MG tablet [id number] Yes Take 40 mg by mouth daily. [provider]  Active Self, Pharmacy Records  rosuvastatin  (CRESTOR ) 10 MG tablet [id number]  Take 1 tablet (10 mg total) by mouth daily. Lesia Ozell Barter, PA-C  Active   tamsulosin (FLOMAX) 0.4 MG CAPS capsule [id number] Yes Take 0.4 mg by mouth daily. [provider]  Active Self, Pharmacy Records            Goals Addressed             This Visit's Progress    VBCI  Transitions of Care (TOC) Care Plan       Problems:  Recent Hospitalization for treatment of Complete heart block with pacemaker insertion Knowledge Deficit Related to heart block and pacemaker PCP visit 8/19 and Cardiology 8/22  Goal:  Over the next 30 days, the patient will not experience hospital readmission  Interventions:  Transitions of Care: Doctor Visits  - discussed the importance of doctor visits Post-op wound/incision care reviewed with patient/caregiver Reviewed Signs and symptoms of infection Avoid raising arm above pacemaker Avoid heavy lifting pushing or pulling Keep site clean and dry When you receive your pacemaker ID card, keep it with you at all times.      Patient Self Care Activities:  Call provider office for new concerns or questions  Notify RN Care Manager of TOC call rescheduling needs Participate in Transition of Care Program/Attend TOC scheduled calls Take medications as prescribed    Plan:  The patient has been provided with contact information for the care management team and has been advised to call with any health related questions or concerns.         Recommendation:   Continue Current Plan of Care  Follow Up Plan:   Telephone follow-up in 1 week  Walt Geathers J. Rogena Deupree RN, MSN [facility], [facility] RN Care Manager Direct Dial: [phone]  Fax: [phone] Website: [email or web address]

## 2024-06-25 ENCOUNTER — Encounter: Payer: Self-pay | Admitting: Student

## 2024-06-25 ENCOUNTER — Ambulatory Visit: Attending: Student | Admitting: Student

## 2024-06-25 DIAGNOSIS — I442 Atrioventricular block, complete: Secondary | ICD-10-CM | POA: Diagnosis not present

## 2024-06-25 LAB — CUP PACEART INCLINIC DEVICE CHECK
Battery Remaining Longevity: 127 mo
Battery Voltage: 3.05 V
Brady Statistic RA Percent Paced: 33 %
Brady Statistic RV Percent Paced: 99.92 %
Date Time Interrogation Session: 20250822102058
Implantable Lead Connection Status: 753985
Implantable Lead Connection Status: 753985
Implantable Lead Implant Date: 20250804
Implantable Lead Implant Date: 20250804
Implantable Lead Location: 753859
Implantable Lead Location: 753860
Implantable Pulse Generator Implant Date: 20250804
Lead Channel Impedance Value: 437.5 Ohm
Lead Channel Impedance Value: 575 Ohm
Lead Channel Pacing Threshold Amplitude: 0.625 V
Lead Channel Pacing Threshold Amplitude: 0.75 V
Lead Channel Pacing Threshold Pulse Width: 0.5 ms
Lead Channel Pacing Threshold Pulse Width: 0.5 ms
Lead Channel Sensing Intrinsic Amplitude: 12 mV
Lead Channel Sensing Intrinsic Amplitude: 5 mV
Lead Channel Setting Pacing Amplitude: 1 V
Lead Channel Setting Pacing Amplitude: 1.625
Lead Channel Setting Pacing Pulse Width: 0.5 ms
Lead Channel Setting Sensing Sensitivity: 4 mV
Pulse Gen Model: 2272
Pulse Gen Serial Number: 8291287

## 2024-06-25 NOTE — Patient Instructions (Signed)
 Medication Instructions:  Your physician recommends that you continue on your current medications as directed. Please refer to the Current Medication list given to you today.  *If you need a refill on your cardiac medications before your next appointment, please call your pharmacy*  Lab Work: None ordered If you have labs (blood work) drawn today and your tests are completely normal, you will receive your results only by: MyChart Message (if you have MyChart) OR A paper copy in the mail If you have any lab test that is abnormal or we need to change your treatment, we will call you to review the results.  Follow-Up: At [facility], you and your health needs are our priority.  As part of our continuing mission to provide you with exceptional heart care, our providers are all part of one team.  This team includes your primary Cardiologist (physician) and Advanced Practice Providers or APPs (Physician Assistants and Nurse Practitioners) who all work together to provide you with the care you need, when you need it.  Your next appointment:   As scheduled

## 2024-06-25 NOTE — Progress Notes (Signed)
 Normal dual chamber pacemaker wound check. Presenting rhythm: AP/VP. Wound well healed. Routine testing performed. Thresholds, sensing, and impedances consistent with implant measurements and at 3.5V safety margin/auto capture until 3 month visit. No episodes. Reviewed arm restrictions to continue for 6 weeks total post op.  Pt enrolled in remote follow-up.

## 2024-06-27 NOTE — Progress Notes (Unsigned)
 Cardiology Office Note    Date:  06/30/2024  ID:  Renly Roots, DOB [birth year], MRN [id number] PCP:  Clarice Nottingham, MD  Cardiologist:  Darryle ONEIDA Decent, MD  Electrophysiologist:  None   Chief Complaint: Follow up for AS   History of Present Illness: .    Sergio Zawislak is a 73 y.o. male with visit-pertinent history of hypertension, hyperlipidemia, GERD, diabetes, mild AS, CKD, OSA.  Patient presented to the ED on 06/03/2024 with complaints of chest pressure and shortness of breath.  EKG showed complete heart block with heart rate in the 30s.  His home medications included bisoprolol 10 mg daily which he reported he been taking for years without issues.  Patient reported that his symptoms of chest pressure, dizziness, shortness of breath started 1 month prior.  He reported that his chest pressure had only ever been as 5-6 out of 10.  He denied any episodes of syncope but did report increased dizziness as of late.  BP on presentation was 2 8/54, heart rate 30s to 40s.  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  Patient was admitted for heart block and beta-blocker washout.  Patient continued to have complete heart block with intermittent ventricular escape rhythm despite washout.  Patient underwent implantation of a Abbott dual-chamber PPM on 8/4 by Dr. Kennyth.  Today he presents for follow up. He denies shortness of breath, lower extremity edema, orthopnea or pnd.  He reports that following his pacemaker insertion he has not had any further dizziness, lightheadedness, presyncope.  He denies any syncope.  Patient reports that he has been  doing well.  He notes an occasional sharp pinprick pain in the left upper shoulder near his pacer site, denies any exertional chest pain, pressure or tightness. ROS: .   Today he denies shortness of breath, lower extremity edema, fatigue, palpitations, melena, hematuria, hemoptysis, diaphoresis, weakness, presyncope, syncope, orthopnea, and PND.  All other systems are reviewed and otherwise negative. Studies Reviewed: SABRA   EKG:  EKG is not ordered today.  CV Studies: Cardiac studies reviewed are outlined and summarized above. Otherwise please see EMR for full report. Cardiac Studies & Procedures   ______________________________________________________________________________________________   STRESS TESTS  MYOCARDIAL PERFUSION IMAGING 01/06/2020  Interpretation Summary  Lexiscan  stress without EKG changes  Probable normal perfusion with mild diaphragmatic attenuation. No ischemia or scar  LVEF 53%  This is a low risk study.   ECHOCARDIOGRAM  ECHOCARDIOGRAM COMPLETE 06/04/2024  Narrative ECHOCARDIOGRAM REPORT    Patient Name:   MCCARTNEY CHUBA Date of Exam: 06/04/2024 Medical Rec #:  [id number]         Height:       70.0 in Accession #:    [id number]        Weight:       220.5 lb Date of Birth:  1951/03/22          BSA:          2.176 m Patient Age:    73 years          BP:           178/79 mmHg Patient Gender: M  HR:           36 bpm. Exam Location:  Inpatient  Procedure: 2D Echo, Cardiac Doppler and Color Doppler (Both Spectral and Color Flow Doppler were utilized during procedure).  Indications:    ; I44.2 Complete heart block  History:        Patient has prior history of Echocardiogram examinations, most recent 05/09/2023. CAD, Signs/Symptoms:Chest Pain, Dizziness/Lightheadedness, Shortness of Breath and Dyspnea; Risk Factors:Sleep Apnea, Former Smoker, Hypertension, Diabetes and Dyslipidemia.  Sonographer:    Ellouise Mose RDCS Referring Phys: [id number]  TAYLOR A PARCELLS   Sonographer Comments: Technically difficult study due to poor echo windows. IMPRESSIONS   1. Left ventricular ejection fraction, by estimation, is 70 to 75%. The left ventricle has hyperdynamic function. The left ventricle has no regional wall motion abnormalities. There is moderate asymmetric left ventricular hypertrophy of the basal-septal segment. Left ventricular diastolic parameters were normal. 2. Right ventricular systolic function is normal. The right ventricular size is mildly enlarged. Tricuspid regurgitation signal is inadequate for assessing PA pressure. 3. The mitral valve is normal in structure. Trivial mitral valve regurgitation. No evidence of mitral stenosis. 4. The aortic valve is tricuspid. There is mild calcification of the aortic valve. Aortic valve regurgitation is moderate. Mild aortic valve stenosis. 5. Aortic dilatation noted. There is dilatation of the ascending aorta, measuring 43 mm. 6. The inferior vena cava is normal in size with greater than 50% respiratory variability, suggesting right atrial pressure of 3 mmHg.  FINDINGS Left Ventricle: Left ventricular ejection fraction, by estimation, is 70 to 75%. The left ventricle has hyperdynamic function. The left ventricle has no regional wall motion abnormalities. The left ventricular internal cavity size was normal in size. There is moderate asymmetric left ventricular hypertrophy of the basal-septal segment. Left ventricular diastolic parameters were normal.  Right Ventricle: The right ventricular size is mildly enlarged. No increase in right ventricular wall thickness. Right ventricular systolic function is normal. Tricuspid regurgitation signal is inadequate for assessing PA pressure.  Left Atrium: Left atrial size was normal in size.  Right Atrium: Right atrial size was normal in size.  Pericardium: There is no evidence of pericardial effusion.  Mitral Valve: The mitral valve is normal in  structure. Trivial mitral valve regurgitation. No evidence of mitral valve stenosis.  Tricuspid Valve: The tricuspid valve is normal in structure. Tricuspid valve regurgitation is trivial. No evidence of tricuspid stenosis.  Aortic Valve: The aortic valve is tricuspid. There is mild calcification of the aortic valve. Aortic valve regurgitation is moderate. Mild aortic stenosis is present. Aortic valve mean gradient measures 16.0 mmHg. Aortic valve peak gradient measures 31.1 mmHg. Aortic valve area, by VTI measures 3.16 cm.  Pulmonic Valve: The pulmonic valve was normal in structure. Pulmonic valve regurgitation is trivial. No evidence of pulmonic stenosis.  Aorta: Aortic dilatation noted. There is dilatation of the ascending aorta, measuring 43 mm.  Venous: The inferior vena cava is normal in size with greater than 50% respiratory variability, suggesting right atrial pressure of 3 mmHg.  IAS/Shunts: No atrial level shunt detected by color flow Doppler.   LEFT VENTRICLE PLAX 2D LVIDd:         4.89 cm      Diastology LVIDs:         2.70 cm      LV e' medial:    15.90 cm/s LV PW:         0.94 cm      LV E/e' medial:  4.4 LV  IVS:        1.25 cm      LV e' lateral:   10.00 cm/s LVOT diam:     2.54 cm      LV E/e' lateral: 6.9 LV SV:         208 LV SV Index:   96 LVOT Area:     5.07 cm  LV Volumes (MOD) LV vol d, MOD A2C: 161.0 ml LV vol d, MOD A4C: 154.0 ml LV vol s, MOD A2C: 32.9 ml LV vol s, MOD A4C: 32.5 ml LV SV MOD A2C:     128.1 ml LV SV MOD A4C:     154.0 ml LV SV MOD BP:      127.0 ml  RIGHT VENTRICLE             IVC RV S prime:     16.30 cm/s  IVC diam: 1.58 cm TAPSE (M-mode): 2.8 cm  LEFT ATRIUM             Index        RIGHT ATRIUM           Index LA diam:        4.29 cm 1.97 cm/m   RA Area:     15.00 cm LA Vol (A2C):   36.0 ml 16.55 ml/m  RA Volume:   41.90 ml  19.26 ml/m LA Vol (A4C):   48.7 ml 22.39 ml/m LA Biplane Vol: 42.7 ml 19.63 ml/m AORTIC  VALVE AV Area (Vmax):    3.12 cm AV Area (Vmean):   2.96 cm AV Area (VTI):     3.16 cm AV Vmax:           279.00 cm/s AV Vmean:          182.000 cm/s AV VTI:            0.658 m AV Peak Grad:      31.1 mmHg AV Mean Grad:      16.0 mmHg LVOT Vmax:         172.00 cm/s LVOT Vmean:        106.250 cm/s LVOT VTI:          0.410 m LVOT/AV VTI ratio: 0.62  AORTA Ao Root diam: 3.84 cm Ao Asc diam:  4.24 cm  MITRAL VALVE MV Area (PHT): 2.39 cm     SHUNTS MV Decel Time: 317 msec     Systemic VTI:  0.41 m MV E velocity: 69.20 cm/s   Systemic Diam: 2.54 cm MV A velocity: 115.00 cm/s MV E/A ratio:  0.60  Lonni Nanas MD Electronically signed by Lonni Nanas MD Signature Date/Time: 06/04/2024/1:25:16 PM    Final          ______________________________________________________________________________________________       Current Reported Medications:.    Current Meds  Medication Sig   acetaminophen  (TYLENOL ) 325 MG tablet Take 2 tablets (650 mg total) by mouth every 4 (four) hours as needed for headache or mild pain (pain score 1-3).   amLODipine  (NORVASC ) 10 MG tablet TAKE 1 TABLET BY MOUTH EVERY DAY **MUST SCHEDULE APPT FOR FUTURE REFILLS**   aspirin  81 MG EC tablet Take 81 mg by mouth daily.   bisoprolol (ZEBETA) 10 MG tablet Take 10 mg by mouth daily.   cetirizine (ZYRTEC) 10 MG tablet Take 10 mg by mouth daily as needed for allergies.   cyanocobalamin  100 MCG tablet Take 100 mcg by mouth daily.   famotidine (PEPCID) 20 MG  tablet Take 20 mg by mouth at bedtime.   metFORMIN (GLUCOPHAGE-XR) 500 MG 24 hr tablet Take 500 mg by mouth in the morning and at bedtime.   olmesartan -hydrochlorothiazide  (BENICAR  HCT) 40-12.5 MG tablet 1 tablet Orally Once a day   pantoprazole (PROTONIX) 40 MG tablet Take 40 mg by mouth daily.   rosuvastatin  (CRESTOR ) 10 MG tablet Take 1 tablet (10 mg total) by mouth daily.   tamsulosin (FLOMAX) 0.4 MG CAPS capsule Take 0.4 mg by mouth  daily.    Physical Exam:    VS:  BP 136/74   Pulse 64   Ht 5' 10 (1.778 m)   Wt 218 lb 12.8 oz (99.2 kg)   SpO2 97%   BMI 31.39 kg/m    Wt Readings from Last 3 Encounters:  06/30/24 218 lb 12.8 oz (99.2 kg)  06/03/24 220 lb 7.4 oz (100 kg)  05/13/23 226 lb (102.5 kg)    GEN: Well nourished, well developed in no acute distress NECK: No JVD; No carotid bruits CARDIAC: RRR, 2/6 SEM, no rubs or gallops RESPIRATORY:  Clear to auscultation without rales, wheezing or rhonchi  ABDOMEN: Soft, non-tender, non-distended EXTREMITIES:  No edema; No acute deformity     Asessement and Plan:.    Complete heart block: S/p implantation of Abbott dual-chamber PPM on 8/4 by Dr. Kennyth. Today he reports that he is doing well, denies any further dizziness, lightheadedness, presyncope or syncope.  He denies any palpitations or feeling of irregular heartbeats.   Hypertension: Initial blood pressure today 151/62, on recheck was 136/74.  Patient reports that his blood pressure is always slightly elevated when in doctors offices, reports better control at home.  Continue amlodipine  10 mg daily, bisoprolol 10 mg daily, Benicar  40-12.5 mg daily.  Hyperlipidemia: Last lipid profile on 06/04/2024 indicated total cholesterol 102, HDL 50, triglycerides 65 and LDL 39.  Continue Crestor  10 mg daily.  Mild AS/Moderate AI: Noted on echocardiogram during recent admission with moderate aortic valve regurgitation.  Patient denies any chest pain, pressure or tightness he denies any lower extremity edema, shortness of breath, orthopnea or PND.  Will plan to continue monitoring with yearly echocardiograms.  OSA: Reports CPAP compliance.     Disposition: F/u with Dr. Barbaraann in six months or sooner if needed.   Signed, Lorell Thibodaux D Cosandra Plouffe, NP

## 2024-06-28 ENCOUNTER — Ambulatory Visit: Payer: Self-pay | Admitting: Cardiology

## 2024-06-30 ENCOUNTER — Encounter: Payer: Self-pay | Admitting: Cardiology

## 2024-06-30 ENCOUNTER — Ambulatory Visit: Attending: Cardiology | Admitting: Cardiology

## 2024-06-30 VITALS — BP 136/74 | HR 64 | Ht 70.0 in | Wt 218.8 lb

## 2024-06-30 DIAGNOSIS — I10 Essential (primary) hypertension: Secondary | ICD-10-CM | POA: Diagnosis not present

## 2024-06-30 DIAGNOSIS — G4733 Obstructive sleep apnea (adult) (pediatric): Secondary | ICD-10-CM

## 2024-06-30 DIAGNOSIS — I442 Atrioventricular block, complete: Secondary | ICD-10-CM

## 2024-06-30 DIAGNOSIS — E782 Mixed hyperlipidemia: Secondary | ICD-10-CM

## 2024-06-30 DIAGNOSIS — I351 Nonrheumatic aortic (valve) insufficiency: Secondary | ICD-10-CM

## 2024-06-30 DIAGNOSIS — I350 Nonrheumatic aortic (valve) stenosis: Secondary | ICD-10-CM

## 2024-06-30 NOTE — Patient Instructions (Signed)
 Medication Instructions:  No changes *If you need a refill on your cardiac medications before your next appointment, please call your pharmacy*  Lab Work: No labs  Testing/Procedures: No testing  Follow-Up: At [facility], you and your health needs are our priority.  As part of our continuing mission to provide you with exceptional heart care, our providers are all part of one team.  This team includes your primary Cardiologist (physician) and Advanced Practice Providers or APPs (Physician Assistants and Nurse Practitioners) who all work together to provide you with the care you need, when you need it.  Your next appointment:   6 month(s)  Provider:   Darryle ONEIDA Decent, MD

## 2024-07-01 ENCOUNTER — Other Ambulatory Visit: Payer: Self-pay

## 2024-07-01 NOTE — Transitions of Care (Post Inpatient/ED Visit) (Signed)
 Transition of Care week 4  Visit Note  07/01/2024  Name: [name] Holt MRN: [id number]          DOB: 02-09-1951  Situation: Patient enrolled in [facility] 30-day program. Visit completed with patient by telephone.   Background:   Initial Transition Care Management Follow-up Telephone Call    Past Medical History:  Diagnosis Date   Allergic rhinitis    Chest pain    NON CARDIAC   CKD (chronic kidney disease)    Diabetes mellitus without complication (HCC)    GERD (gastroesophageal reflux disease)    Hyperlipidemia    Hypertension    Paresthesia    face   Sleep apnea     Assessment: Patient Reported Symptoms: Cognitive Cognitive Status: Alert and oriented to person, place, and time, Normal speech and language skills      Neurological Neurological Review of Symptoms: No symptoms reported    HEENT HEENT Symptoms Reported: No symptoms reported      Cardiovascular Cardiovascular Symptoms Reported: No symptoms reported Cardiovascular Management Strategies: Routine screening Cardiovascular Self-Management Outcome: 4 (good)  Respiratory      Endocrine Endocrine Symptoms Reported: No symptoms reported Is patient diabetic?: Yes Is patient checking blood sugars at home?: Yes List most recent blood sugar readings, include date and time of day: Most recent blood sugar 122 Endocrine Self-Management Outcome: 4 (good)  Gastrointestinal Gastrointestinal Symptoms Reported: No symptoms reported      Genitourinary Genitourinary Symptoms Reported: No symptoms reported    Integumentary Integumentary Symptoms Reported: No symptoms reported Skin Self-Management Outcome: 4 (good)  Musculoskeletal Musculoskelatal Symptoms Reviewed: No symptoms reported        Psychosocial Psychosocial Symptoms Reported: No symptoms reported         There were no vitals filed for this visit.  Medications Reviewed Today     Reviewed by Jhovany Weidinger, RN (Case Manager) on 07/01/24 at 1101  Med  List Status: <None>   Medication Order Taking? Sig Documenting Provider Last Dose Status Informant  acetaminophen  (TYLENOL ) 325 MG tablet [id number] Yes Take 2 tablets (650 mg total) by mouth every 4 (four) hours as needed for headache or mild pain (pain score 1-3). Lesia Ozell Barter, PA-C  Active   amLODipine  (NORVASC ) 10 MG tablet [id number] Yes TAKE 1 TABLET BY MOUTH EVERY DAY **MUST SCHEDULE APPT FOR FUTURE REFILLS** O'Neal, Darryle Ned, MD  Active Self, Pharmacy Records  aspirin  81 MG EC tablet [id number] Yes Take 81 mg by mouth daily. [provider]  Active Self, Pharmacy Records  bisoprolol (ZEBETA) 10 MG tablet [id number] Yes Take 10 mg by mouth daily. [provider]  Active Self, Pharmacy Records  cetirizine (ZYRTEC) 10 MG tablet [id number] Yes Take 10 mg by mouth daily as needed for allergies. [provider]  Active Self, Pharmacy Records  cyanocobalamin  100 MCG tablet [id number] Yes Take 100 mcg by mouth daily. [provider]  Active Self, Pharmacy Records  famotidine (PEPCID) 20 MG tablet [id number] Yes Take 20 mg by mouth at bedtime. [provider]  Active Self, Pharmacy Records  metFORMIN (GLUCOPHAGE-XR) 500 MG 24 hr tablet [id number] Yes Take 500 mg by mouth in the morning and at bedtime. [provider]  Active Self, Pharmacy Records  olmesartan -hydrochlorothiazide  (BENICAR  HCT) 40-12.5 MG tablet [id number] Yes 1 tablet Orally Once a day [provider]  Active Self, Pharmacy Records  pantoprazole (PROTONIX) 40 MG tablet [id number] Yes Take 40 mg by mouth daily. [provider]  Active Self, Pharmacy Records  rosuvastatin  (CRESTOR ) 10 MG tablet [id number] Yes Take 1 tablet (10 mg total) by mouth daily. Lesia Ozell Barter, PA-C  Active   tamsulosin (FLOMAX) 0.4 MG CAPS capsule [id number] Yes Take 0.4 mg by mouth daily. [provider]  Active Self, Pharmacy Records            Goals Addressed              This Visit's Progress    [facility] Transitions of Care (TOC) Care Plan       Problems:  Recent Hospitalization for treatment of Complete heart block with pacemaker insertion Knowledge Deficit Related to heart block and pacemaker   Goal:  Over the next 30 days, the patient will not experience hospital readmission  Interventions:  Transitions of Care: Doctor Visits  - discussed the importance of doctor visits Post-op wound/incision care reviewed with patient/caregiver Reviewed Signs and symptoms of infection Avoid raising arm above pacemaker Avoid heavy lifting pushing or pulling Keep site clean and dry When you receive your pacemaker ID card, keep it with you at all times.      Patient Self Care Activities:  Call provider office for new concerns or questions  Notify RN Care Manager of TOC call rescheduling needs Participate in Transition of Care Program/Attend TOC scheduled calls Take medications as prescribed    Plan:  The patient has been provided with contact information for the care management team and has been advised to call with any health related questions or concerns.         Recommendation:   Continue Current Plan of Care  Follow Up Plan:   Telephone follow-up in 1 week  Monice Lundy J. Blayne Frankie RN, MSN [facility] [place], [facility] RN Care Manager Direct Dial: [phone]  Fax: [phone] Website: [email or web address]

## 2024-07-06 DIAGNOSIS — N401 Enlarged prostate with lower urinary tract symptoms: Secondary | ICD-10-CM | POA: Diagnosis not present

## 2024-07-06 DIAGNOSIS — R351 Nocturia: Secondary | ICD-10-CM | POA: Diagnosis not present

## 2024-07-06 DIAGNOSIS — R350 Frequency of micturition: Secondary | ICD-10-CM | POA: Diagnosis not present

## 2024-07-06 DIAGNOSIS — R3912 Poor urinary stream: Secondary | ICD-10-CM | POA: Diagnosis not present

## 2024-07-08 ENCOUNTER — Other Ambulatory Visit: Payer: Self-pay

## 2024-07-08 NOTE — Transitions of Care (Post Inpatient/ED Visit) (Signed)
 Transition of Care Week 5  Visit Note  07/08/2024  Name: [name] Holt MRN: [id number]          DOB: Sep 17, 1951  Situation: Patient enrolled in [facility] 30-day program. Visit completed with patient by telephone.   Background:   Initial Transition Care Management Follow-up Telephone Call    Past Medical History:  Diagnosis Date   Allergic rhinitis    Chest pain    NON CARDIAC   CKD (chronic kidney disease)    Diabetes mellitus without complication (HCC)    GERD (gastroesophageal reflux disease)    Hyperlipidemia    Hypertension    Paresthesia    face   Sleep apnea     Assessment: Patient Reported Symptoms: Cognitive Cognitive Status: Alert and oriented to person, place, and time, Normal speech and language skills      Neurological Neurological Review of Symptoms: No symptoms reported    HEENT HEENT Symptoms Reported: No symptoms reported      Cardiovascular Cardiovascular Symptoms Reported: No symptoms reported Cardiovascular Management Strategies: Routine screening  Respiratory Respiratory Symptoms Reported: No symptoms reported    Endocrine Endocrine Symptoms Reported: No symptoms reported Is patient diabetic?: Yes Is patient checking blood sugars at home?: Yes List most recent blood sugar readings, include date and time of day: Most recent sugar 133 Endocrine Self-Management Outcome: 4 (good)  Gastrointestinal Gastrointestinal Symptoms Reported: No symptoms reported      Genitourinary Genitourinary Symptoms Reported: No symptoms reported    Integumentary Integumentary Symptoms Reported: No symptoms reported Additional Integumentary Details: Pacemaker site healed Skin Management Strategies: Routine screening Skin Self-Management Outcome: 4 (good)  Musculoskeletal Musculoskelatal Symptoms Reviewed: No symptoms reported   Falls in the past year?: No    Psychosocial Psychosocial Symptoms Reported: No symptoms reported         There were no vitals filed  for this visit.  Medications Reviewed Today     Reviewed by Dinah Lupa, RN (Case Manager) on 07/08/24 at 1109  Med List Status: <None>   Medication Order Taking? Sig Documenting Provider Last Dose Status Informant  acetaminophen  (TYLENOL ) 325 MG tablet [id number] Yes Take 2 tablets (650 mg total) by mouth every 4 (four) hours as needed for headache or mild pain (pain score 1-3). Lesia Ozell Barter, PA-C  Active   amLODipine  (NORVASC ) 10 MG tablet [id number] Yes TAKE 1 TABLET BY MOUTH EVERY DAY **MUST SCHEDULE APPT FOR FUTURE REFILLS** O'Neal, Darryle Ned, MD  Active Self, Pharmacy Records  aspirin  81 MG EC tablet [id number] Yes Take 81 mg by mouth daily. [provider]  Active Self, Pharmacy Records  bisoprolol (ZEBETA) 10 MG tablet [id number] Yes Take 10 mg by mouth daily. [provider]  Active Self, Pharmacy Records  cetirizine (ZYRTEC) 10 MG tablet [id number] Yes Take 10 mg by mouth daily as needed for allergies. [provider]  Active Self, Pharmacy Records  cyanocobalamin  100 MCG tablet [id number] Yes Take 100 mcg by mouth daily. [provider]  Active Self, Pharmacy Records  famotidine (PEPCID) 20 MG tablet [id number] Yes Take 20 mg by mouth at bedtime. [provider]  Active Self, Pharmacy Records  metFORMIN (GLUCOPHAGE-XR) 500 MG 24 hr tablet [id number] Yes Take 500 mg by mouth in the morning and at bedtime. [provider]  Active Self, Pharmacy Records  olmesartan -hydrochlorothiazide  (BENICAR  HCT) 40-12.5 MG tablet [id number] Yes 1 tablet Orally Once a day [provider]  Active Self, Pharmacy Records  pantoprazole (PROTONIX) 40 MG tablet [id number] Yes Take 40  mg by mouth daily. [provider]  Active Self, Pharmacy Records  rosuvastatin  (CRESTOR ) 10 MG tablet [id number] Yes Take 1 tablet (10 mg total) by mouth daily. Lesia Ozell Barter, PA-C  Active   tamsulosin (FLOMAX) 0.4 MG CAPS capsule [id number] Yes  Take 0.4 mg by mouth daily. [provider]  Active Self, Pharmacy Records            Goals Addressed             This Visit's Progress    COMPLETED: [facility]I Transitions of Care (TOC) Care Plan       Problems:  Recent Hospitalization for treatment of Complete heart block with pacemaker insertion Knowledge Deficit Related to heart block and pacemaker  07/08/24 Patient reports he is doing well.  Maintaining health.  Did get blood sugar supplies. Closing patient to [facility] program.  Patient agreeable.   Goal:  Over the next 30 days, the patient will not experience hospital readmission  Interventions:  Transitions of Care: Doctor Visits  - discussed the importance of doctor visits Post-op wound/incision care reviewed with patient/caregiver Reviewed Signs and symptoms of infection Avoid raising arm above pacemaker Avoid heavy lifting pushing or pulling Keep site clean and dry When you receive your pacemaker ID card, keep it with you at all times.      Patient Self Care Activities:  Call provider office for new concerns or questions  Notify RN Care Manager of TOC call rescheduling needs Participate in Transition of Care Program/Attend TOC scheduled calls Take medications as prescribed    Plan:  The patient has been provided with contact information for the care management team and has been advised to call with any health related questions or concerns.         Recommendation:   Close to [facility] program-goals met  Follow Up Plan:   Closing From:  Transitions of Care Program  Renwick Asman DOROTHA Seeds RN, MSN [place]  [facility], [facility] RN Care Manager Direct Dial: [phone]  Fax: [phone] Website: [email or web address]

## 2024-07-08 NOTE — Patient Instructions (Signed)
 Visit Information  Thank you for taking time to visit with me today. Please don't hesitate to contact me if I can be of assistance to you   Following is a copy of your care plan:   Goals Addressed             This Visit's Progress    COMPLETED: VBCI Transitions of Care (TOC) Care Plan       Problems:  Recent Hospitalization for treatment of Complete heart block with pacemaker insertion Knowledge Deficit Related to heart block and pacemaker  07/08/24 Patient reports he is doing well.  Maintaining health.  Did get blood sugar supplies. Closing patient to [facility] program.  Patient agreeable.   Goal:  Over the next 30 days, the patient will not experience hospital readmission  Interventions:  Transitions of Care: Doctor Visits  - discussed the importance of doctor visits Post-op wound/incision care reviewed with patient/caregiver Reviewed Signs and symptoms of infection Avoid raising arm above pacemaker Avoid heavy lifting pushing or pulling Keep site clean and dry When you receive your pacemaker ID card, keep it with you at all times.      Patient Self Care Activities:  Call provider office for new concerns or questions  Notify RN Care Manager of TOC call rescheduling needs Participate in Transition of Care Program/Attend TOC scheduled calls Take medications as prescribed    Plan:  The patient has been provided with contact information for the care management team and has been advised to call with any health related questions or concerns.         Patient verbalizes understanding of instructions and care plan provided today and agrees to view in MyChart. Active MyChart status and patient understanding of how to access instructions and care plan via MyChart confirmed with patient.     The patient has been provided with contact information for the care management team and has been advised to call with any health related questions or concerns.   Please call the care guide team at  [phone] if you need to cancel or reschedule your appointment.   Please call the Suicide and Crisis Lifeline: 988 if you are experiencing a Mental Health or Behavioral Health Crisis or need someone to talk to.  Keora Eccleston J. Ronae Noell RN, MSN [facility], [facility] RN Care Manager Direct Dial: [phone]  Fax: [phone] Website: [email or web address]

## 2024-07-20 ENCOUNTER — Ambulatory Visit (INDEPENDENT_AMBULATORY_CARE_PROVIDER_SITE_OTHER)

## 2024-07-20 DIAGNOSIS — I442 Atrioventricular block, complete: Secondary | ICD-10-CM | POA: Diagnosis not present

## 2024-07-21 LAB — CUP PACEART REMOTE DEVICE CHECK
Battery Remaining Longevity: 124 mo
Battery Remaining Percentage: 95.5 %
Battery Voltage: 3.04 V
Brady Statistic AP VP Percent: 32 %
Brady Statistic AP VS Percent: 1 %
Brady Statistic AS VP Percent: 68 %
Brady Statistic AS VS Percent: 1 %
Brady Statistic RA Percent Paced: 32 %
Brady Statistic RV Percent Paced: 99 %
Date Time Interrogation Session: 20250916020014
Implantable Lead Connection Status: 753985
Implantable Lead Connection Status: 753985
Implantable Lead Implant Date: 20250804
Implantable Lead Implant Date: 20250804
Implantable Lead Location: 753859
Implantable Lead Location: 753860
Implantable Pulse Generator Implant Date: 20250804
Lead Channel Impedance Value: 450 Ohm
Lead Channel Impedance Value: 580 Ohm
Lead Channel Pacing Threshold Amplitude: 0.625 V
Lead Channel Pacing Threshold Amplitude: 0.875 V
Lead Channel Pacing Threshold Pulse Width: 0.5 ms
Lead Channel Pacing Threshold Pulse Width: 0.5 ms
Lead Channel Sensing Intrinsic Amplitude: 12 mV
Lead Channel Sensing Intrinsic Amplitude: 5 mV
Lead Channel Setting Pacing Amplitude: 1.125
Lead Channel Setting Pacing Amplitude: 1.625
Lead Channel Setting Pacing Pulse Width: 0.5 ms
Lead Channel Setting Sensing Sensitivity: 4 mV
Pulse Gen Model: 2272
Pulse Gen Serial Number: 8291287

## 2024-07-22 DIAGNOSIS — R3915 Urgency of urination: Secondary | ICD-10-CM | POA: Diagnosis not present

## 2024-07-22 DIAGNOSIS — R350 Frequency of micturition: Secondary | ICD-10-CM | POA: Diagnosis not present

## 2024-07-22 DIAGNOSIS — N401 Enlarged prostate with lower urinary tract symptoms: Secondary | ICD-10-CM | POA: Diagnosis not present

## 2024-07-22 DIAGNOSIS — R351 Nocturia: Secondary | ICD-10-CM | POA: Diagnosis not present

## 2024-07-22 DIAGNOSIS — R3912 Poor urinary stream: Secondary | ICD-10-CM | POA: Diagnosis not present

## 2024-07-25 ENCOUNTER — Ambulatory Visit: Payer: Self-pay | Admitting: Cardiology

## 2024-07-26 DIAGNOSIS — H43811 Vitreous degeneration, right eye: Secondary | ICD-10-CM | POA: Diagnosis not present

## 2024-07-26 DIAGNOSIS — E119 Type 2 diabetes mellitus without complications: Secondary | ICD-10-CM | POA: Diagnosis not present

## 2024-07-26 DIAGNOSIS — H35033 Hypertensive retinopathy, bilateral: Secondary | ICD-10-CM | POA: Diagnosis not present

## 2024-07-26 DIAGNOSIS — H2513 Age-related nuclear cataract, bilateral: Secondary | ICD-10-CM | POA: Diagnosis not present

## 2024-07-26 NOTE — Progress Notes (Signed)
 Remote PPM Transmission

## 2024-08-17 ENCOUNTER — Ambulatory Visit: Admitting: Pulmonary Disease

## 2024-08-17 VITALS — BP 149/65 | HR 66

## 2024-08-17 DIAGNOSIS — G4733 Obstructive sleep apnea (adult) (pediatric): Secondary | ICD-10-CM | POA: Diagnosis not present

## 2024-08-17 NOTE — Progress Notes (Signed)
 [name] Holt    [id number]    07-25-1951  Primary Care Physician:Pharr, Ryan, MD  Referring Physician: Clarice Ryan, MD 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   amLODipine  (NORVASC ) 10 MG tablet TAKE 1 TABLET BY MOUTH EVERY DAY **MUST SCHEDULE APPT FOR FUTURE REFILLS**   aspirin  81 MG EC tablet Take 81 mg by mouth daily.   bisoprolol (ZEBETA) 10 MG tablet Take 10 mg by mouth daily.   cetirizine (ZYRTEC) 10 MG tablet Take 10 mg by mouth daily as needed for allergies.   cyanocobalamin  100 MCG tablet Take 100 mcg by mouth daily.   famotidine (PEPCID) 20 MG tablet Take 20 mg by mouth at bedtime.   metFORMIN (GLUCOPHAGE-XR) 500 MG 24 hr tablet Take 500 mg by mouth in the morning and at bedtime.   olmesartan -hydrochlorothiazide  (BENICAR  HCT) 40-12.5 MG tablet 1 tablet Orally Once a day   pantoprazole (PROTONIX) 40 MG tablet Take 40 mg by mouth daily.   rosuvastatin  (CRESTOR ) 10 MG tablet Take 1 tablet (10 mg total) by mouth daily.   tamsulosin (FLOMAX) 0.4 MG CAPS capsule Take 0.4 mg by mouth daily.   No facility-administered encounter medications on file as of 08/17/2024.     Allergies as of 08/17/2024   (No Known Allergies)    Past Medical History:  Diagnosis Date   Allergic rhinitis    Chest pain    NON CARDIAC   CKD (chronic kidney disease)    Diabetes mellitus without complication (HCC)    GERD (gastroesophageal reflux disease)    Hyperlipidemia    Hypertension    Paresthesia    face   Sleep apnea     Past Surgical History:  Procedure Laterality Date   CYSTECTOMY     LEFT WRIST   PACEMAKER IMPLANT N/A 06/07/2024   Procedure: PACEMAKER IMPLANT;  Surgeon: Kennyth Chew, MD;  Location: [facility] INVASIVE CV LAB;  Service: Cardiovascular;  Laterality: N/A;    Family History  Problem Relation Age of Onset   Hypertension Mother    Stroke Mother    Diabetes Mother    Heart disease Mother    Heart disease Father    Stroke Father    Heart attack Father    Stroke Brother    Heart disease Brother    Pneumonia Brother    Stroke Brother    Heart disease Brother    Heart attack Brother     Social History   Socioeconomic History   Marital status: Married    Spouse name: Not on file   Number of children: Not on file   Years of  education: Not on file   Highest education level: Not on file  Occupational History   Not on file  Tobacco Use   Smoking status: Former    Current packs/day: 0.00    Types: Cigarettes    Start date: 63    Quit date: 2000    Years since quitting: 25.8   Smokeless tobacco: Never  Vaping Use   Vaping status: Never Used  Substance and Sexual Activity   Alcohol use: Not Currently   Drug use: No   Sexual activity: Not on file  Other Topics Concern   Not on file  Social History Narrative   Lives with wife   Social Drivers of Health   Financial Resource Strain: Not on file  Food Insecurity: No Food Insecurity (06/09/2024)   Hunger Vital Sign    Worried About Running Out of Food in the Last Year: Never true    Ran Out of Food in the Last Year: Never true  Transportation Needs: No Transportation Needs  (06/09/2024)   PRAPARE - Administrator, Civil Service (Medical): No    Lack of Transportation (Non-Medical): No  Physical Activity: Not on file  Stress: Not on file  Social Connections: Moderately Integrated (06/03/2024)   Social Connection and Isolation Panel    Frequency of Communication with Friends and Family: Three times a week    Frequency of Social Gatherings with Friends and Family: Once a week    Attends Religious Services: 1 to 4 times per year    Active Member of [employer] or Organizations: No    Attends Banker Meetings: Never    Marital Status: Married  Catering manager Violence: Not At Risk (06/09/2024)   Humiliation, Afraid, Rape, and Kick questionnaire    Fear of Current or Ex-Partner: No    Emotionally Abused: No    Physically Abused: No    Sexually Abused: No    Review of Systems  Respiratory:  Positive for apnea.   Psychiatric/Behavioral:  Positive for sleep disturbance.     There were no vitals filed for this visit.    Physical Exam Constitutional:      Appearance: He is obese.  HENT:     Nose: No congestion or rhinorrhea.     Mouth/Throat:     Comments: Mallampati 4, macroglossia Eyes:     General: No scleral icterus. Cardiovascular:     Rate and Rhythm: Normal rate and regular rhythm.     Heart sounds: No murmur heard.    No friction rub.  Pulmonary:     Effort: No respiratory distress.     Breath sounds: No stridor. No wheezing or rhonchi.  Musculoskeletal:        General: Normal range of motion.     Cervical back: No rigidity or tenderness.  Neurological:     Mental Status: He is alert.  Psychiatric:        Mood and Affect: Mood normal.       03/15/2021    1:00 PM  Results of the Epworth flowsheet  Sitting and reading 2  Watching TV 2  Sitting, inactive in a public place (e.g. a theatre or a meeting) 2  As a passenger in a car for an hour without a break 3  Lying down to rest in the afternoon when circumstances  permit 0  Sitting and talking to someone 1  Sitting quietly after a lunch without alcohol 3  In a car, while stopped for a few  minutes in traffic 0  Total score 13     Data Reviewed: Echocardiogram February 2021 shows severe left ventricular hypertrophy, normal ejection fraction, moderately elevated pulmonary artery systolic pressure Repeat echocardiogram shows normal pulmonary pressures  Compliance data reviewed showing 89% compliance Average use of 4 hours 59 minutes AutoSet 5-20 Residual AHI of 4.0  Assessment:  Obstructive sleep apnea with good control of symptoms -Continues to tolerate CPAP well  Class I obesity - Continue weight loss efforts  Plan/Recommendations:  Continue CPAP on a nightly basis  - Machine is working well - Continues to benefit from treatment  Follow-up a year from now  Encouraged to call us  with significant concerns   Jennet Epley MD [facility] 08/17/2024, 11:03 AM  CC: Clarice Nottingham, MD

## 2024-08-17 NOTE — Patient Instructions (Signed)
 Sleep apnea is well treated - Download from the machine shows it is working well and effective in controlling apnea events  Continue regular exercises  Follow-up a year from now  Call us  with significant concerns

## 2024-08-27 DIAGNOSIS — E7800 Pure hypercholesterolemia, unspecified: Secondary | ICD-10-CM | POA: Diagnosis not present

## 2024-08-27 DIAGNOSIS — I10 Essential (primary) hypertension: Secondary | ICD-10-CM | POA: Diagnosis not present

## 2024-08-27 DIAGNOSIS — I2510 Atherosclerotic heart disease of native coronary artery without angina pectoris: Secondary | ICD-10-CM | POA: Diagnosis not present

## 2024-08-30 DIAGNOSIS — E119 Type 2 diabetes mellitus without complications: Secondary | ICD-10-CM | POA: Diagnosis not present

## 2024-08-30 DIAGNOSIS — H35033 Hypertensive retinopathy, bilateral: Secondary | ICD-10-CM | POA: Diagnosis not present

## 2024-08-30 DIAGNOSIS — H2513 Age-related nuclear cataract, bilateral: Secondary | ICD-10-CM | POA: Diagnosis not present

## 2024-08-30 DIAGNOSIS — H43811 Vitreous degeneration, right eye: Secondary | ICD-10-CM | POA: Diagnosis not present

## 2024-09-02 DIAGNOSIS — E1169 Type 2 diabetes mellitus with other specified complication: Secondary | ICD-10-CM | POA: Diagnosis not present

## 2024-09-02 DIAGNOSIS — I10 Essential (primary) hypertension: Secondary | ICD-10-CM | POA: Diagnosis not present

## 2024-09-02 DIAGNOSIS — E7800 Pure hypercholesterolemia, unspecified: Secondary | ICD-10-CM | POA: Diagnosis not present

## 2024-09-02 DIAGNOSIS — R931 Abnormal findings on diagnostic imaging of heart and coronary circulation: Secondary | ICD-10-CM | POA: Diagnosis not present

## 2024-09-02 DIAGNOSIS — E7841 Elevated Lipoprotein(a): Secondary | ICD-10-CM | POA: Diagnosis not present

## 2024-09-17 ENCOUNTER — Ambulatory Visit: Admitting: Cardiology

## 2024-09-17 NOTE — Progress Notes (Deleted)
  Electrophysiology Office Note:   Date:  09/17/2024  ID:  [name] Holt, DOB 1950-11-19, MRN [id number]  Primary Cardiologist: Darryle ONEIDA Decent, MD Electrophysiologist: Fonda Kitty, MD  {Click to update primary MD,subspecialty MD or APP then REFRESH:1}    History of Present Illness:   [name] Holt is a 73 y.o. male with h/o GERD, HTN, HLD, and DM2 who is being seen today for 90d post implant follow up.  He presented to ED on 7/31 with CHB. Underwent dual chamber PPM implant on 06/07/24.  Discussed the use of AI scribe software for clinical note transcription with the patient, who gave verbal consent to proceed.  History of Present Illness     Review of systems complete and found to be negative unless listed in HPI.   EP Information / Studies Reviewed:    {EKGtoday:28818}      Echo 06/04/24:  1. Left ventricular ejection fraction, by estimation, is 70 to 75%. The  left ventricle has hyperdynamic function. The left ventricle has no  regional wall motion abnormalities. There is moderate asymmetric left  ventricular hypertrophy of the basal-septal   segment. Left ventricular diastolic parameters were normal.   2. Right ventricular systolic function is normal. The right ventricular  size is mildly enlarged. Tricuspid regurgitation signal is inadequate for  assessing PA pressure.   3. The mitral valve is normal in structure. Trivial mitral valve  regurgitation. No evidence of mitral stenosis.   4. The aortic valve is tricuspid. There is mild calcification of the  aortic valve. Aortic valve regurgitation is moderate. Mild aortic valve  stenosis.   5. Aortic dilatation noted. There is dilatation of the ascending aorta,  measuring 43 mm.   6. The inferior vena cava is normal in size with greater than 50%  respiratory variability, suggesting right atrial pressure of 3 mmHg.   Risk Assessment/Calculations:   {Does this patient have ATRIAL FIBRILLATION?:[phone]} No BP  recorded.  {Refresh Note OR Click here to enter BP  :1}***        Physical Exam:   VS:  There were no vitals taken for this visit.   Wt Readings from Last 3 Encounters:  06/30/24 218 lb 12.8 oz (99.2 kg)  06/03/24 220 lb 7.4 oz (100 kg)  05/13/23 226 lb (102.5 kg)     GEN: Well nourished, well developed in no acute distress NECK: No JVD CARDIAC: {EPRHYTHM:28826}, no murmurs, rubs, gallops RESPIRATORY:  Clear to auscultation without rales, wheezing or rhonchi  ABDOMEN: Soft, non-distended EXTREMITIES:  No edema; No deformity   ASSESSMENT AND PLAN:   Assessment and Plan Assessment & Plan       Follow up with {EPMDS:28135::EP Team} {EPFOLLOW LE:71826}  Signed, Fonda Kitty, MD

## 2024-10-19 ENCOUNTER — Ambulatory Visit

## 2024-10-19 DIAGNOSIS — I442 Atrioventricular block, complete: Secondary | ICD-10-CM

## 2024-10-20 LAB — CUP PACEART REMOTE DEVICE CHECK
Battery Remaining Longevity: 119 mo
Battery Remaining Percentage: 95.5 %
Battery Voltage: 3.02 V
Brady Statistic AP VP Percent: 46 %
Brady Statistic AP VS Percent: 1 %
Brady Statistic AS VP Percent: 52 %
Brady Statistic AS VS Percent: 1 %
Brady Statistic RA Percent Paced: 46 %
Brady Statistic RV Percent Paced: 99 %
Date Time Interrogation Session: 20251216020015
Implantable Lead Connection Status: 753985
Implantable Lead Connection Status: 753985
Implantable Lead Implant Date: 20250804
Implantable Lead Implant Date: 20250804
Implantable Lead Location: 753859
Implantable Lead Location: 753860
Implantable Pulse Generator Implant Date: 20250804
Lead Channel Impedance Value: 410 Ohm
Lead Channel Impedance Value: 540 Ohm
Lead Channel Pacing Threshold Amplitude: 0.625 V
Lead Channel Pacing Threshold Amplitude: 0.875 V
Lead Channel Pacing Threshold Pulse Width: 0.5 ms
Lead Channel Pacing Threshold Pulse Width: 0.5 ms
Lead Channel Sensing Intrinsic Amplitude: 12 mV
Lead Channel Sensing Intrinsic Amplitude: 5 mV
Lead Channel Setting Pacing Amplitude: 1.125
Lead Channel Setting Pacing Amplitude: 1.625
Lead Channel Setting Pacing Pulse Width: 0.5 ms
Lead Channel Setting Sensing Sensitivity: 4 mV
Pulse Gen Model: 2272
Pulse Gen Serial Number: 8291287

## 2024-10-20 NOTE — Progress Notes (Signed)
 Remote PPM Transmission

## 2024-10-22 ENCOUNTER — Ambulatory Visit: Payer: Self-pay | Admitting: Cardiology

## 2024-12-22 ENCOUNTER — Ambulatory Visit: Admitting: Cardiology

## 2025-01-11 ENCOUNTER — Ambulatory Visit: Admitting: Cardiovascular Disease

## 2025-01-18 ENCOUNTER — Encounter

## 2025-04-19 ENCOUNTER — Encounter

## 2025-07-19 ENCOUNTER — Encounter
